# Patient Record
Sex: Female | Born: 1965 | Race: White | Hispanic: No | Smoking: Never smoker
Health system: Southern US, Community
[De-identification: ages and names within clinical notes are randomized; demographics above are authoritative.]

## PROBLEM LIST (undated history)

## (undated) DIAGNOSIS — E119 Type 2 diabetes mellitus without complications: Secondary | ICD-10-CM

## (undated) HISTORY — PX: BACK SURGERY: SHX140

## (undated) HISTORY — PX: GASTROPLASTY DUODENAL SWITCH: SHX1699

---

## 2015-02-20 ENCOUNTER — Encounter: Payer: Self-pay | Admitting: *Deleted

## 2015-02-20 ENCOUNTER — Ambulatory Visit
Admission: EM | Admit: 2015-02-20 | Discharge: 2015-02-20 | Disposition: A | Discharge status: Home / Self Care | Payer: BLUE CROSS/BLUE SHIELD | Attending: Family Medicine | Admitting: Family Medicine

## 2015-02-20 DIAGNOSIS — R300 Dysuria: Secondary | ICD-10-CM | POA: Diagnosis present

## 2015-02-20 DIAGNOSIS — N390 Urinary tract infection, site not specified: Secondary | ICD-10-CM | POA: Insufficient documentation

## 2015-02-20 HISTORY — DX: Type 2 diabetes mellitus without complications: E11.9

## 2015-02-20 LAB — URINALYSIS COMPLETE WITH MICROSCOPIC (ARMC ONLY)
BILIRUBIN URINE: NEGATIVE
GLUCOSE, UA: 250 mg/dL — AB
KETONES UR: NEGATIVE mg/dL
NITRITE: NEGATIVE
PROTEIN: NEGATIVE mg/dL
RBC / HPF: NONE SEEN RBC/hpf (ref <3)
SPECIFIC GRAVITY, URINE: 1.01 (ref 1.005–1.030)
pH: 5.5 (ref 5.0–8.0)

## 2015-02-20 MED ORDER — CIPROFLOXACIN HCL 500 MG PO TABS: 500.0000 mg | ORAL_TABLET | Freq: Two times a day (BID) | ORAL | Status: AC

## 2015-02-20 NOTE — ED Notes (Signed)
Symptons of urinary frequency an dburning started last PM.  Denies fever or chills, u/a pending.

## 2015-02-20 NOTE — ED Provider Notes (Signed)
Patient patient presents today with symptoms of urinary frequency with slight dysuria since yesterday. She denies any fever, chills, flank pain, abdominal pain, nausea, vomiting. She does get UTIs and usually has similar symptoms at the start. Patient denies being sexually active and no longer has regular periods.  Review of systems negative except mentioned above. Vitals as per chart. Gen.-No apparent distress HEENT-no pharyngeal erythema, no exudate Respiratory-CTA bilateral Cardiac-regular rate rhythm Abdomen-obese, nontender, no flank tenderness appreciated, no guarding or rebound  Assessment and Plan-UTI: We will send urine for culture, Cipro prescribed for patient, rest, hydration, seek medical attention if symptoms persist or worsen.    Jolene Provost, MD 02/20/15 1540

## 2015-03-25 LAB — URINE CULTURE

## 2016-02-25 ENCOUNTER — Ambulatory Visit
Admission: EM | Admit: 2016-02-25 | Discharge: 2016-02-25 | Disposition: A | Discharge status: Home / Self Care | Payer: Managed Care, Other (non HMO) | Attending: Family Medicine | Admitting: Family Medicine

## 2016-02-25 DIAGNOSIS — Z79899 Other long term (current) drug therapy: Secondary | ICD-10-CM | POA: Diagnosis not present

## 2016-02-25 DIAGNOSIS — N3001 Acute cystitis with hematuria: Secondary | ICD-10-CM | POA: Diagnosis not present

## 2016-02-25 DIAGNOSIS — Z7984 Long term (current) use of oral hypoglycemic drugs: Secondary | ICD-10-CM | POA: Insufficient documentation

## 2016-02-25 DIAGNOSIS — R300 Dysuria: Secondary | ICD-10-CM | POA: Diagnosis present

## 2016-02-25 DIAGNOSIS — E119 Type 2 diabetes mellitus without complications: Secondary | ICD-10-CM | POA: Insufficient documentation

## 2016-02-25 LAB — URINALYSIS COMPLETE WITH MICROSCOPIC (ARMC ONLY)
Bacteria, UA: NONE SEEN
Bilirubin Urine: NEGATIVE
GLUCOSE, UA: 500 mg/dL — AB
Leukocytes, UA: NEGATIVE
Nitrite: NEGATIVE
Protein, ur: NEGATIVE mg/dL
RBC / HPF: NONE SEEN RBC/hpf (ref 0–5)
SPECIFIC GRAVITY, URINE: 1.015 (ref 1.005–1.030)
pH: 6 (ref 5.0–8.0)

## 2016-02-25 LAB — GLUCOSE, CAPILLARY: GLUCOSE-CAPILLARY: 217 mg/dL — AB (ref 65–99)

## 2016-02-25 MED ORDER — PHENAZOPYRIDINE HCL 200 MG PO TABS: 200.0000 mg | ORAL_TABLET | Freq: Three times a day (TID) | ORAL | Status: DC

## 2016-02-25 MED ORDER — NITROFURANTOIN MONOHYD MACRO 100 MG PO CAPS: 100.0000 mg | ORAL_CAPSULE | Freq: Two times a day (BID) | ORAL | Status: DC

## 2016-02-25 NOTE — ED Provider Notes (Signed)
CSN: [id number]     Arrival date & time 02/25/16  [id number] History   None    Chief Complaint  Patient presents with  . Urinary Frequency    Pt with hx of UTIs. Awoke today with frequency and dysuria.    (Consider location/radiation/quality/duration/timing/severity/associated sxs/prior Treatment) HPI Comments: Patient presents today for possible urinary tract infection. Symptoms started this morning. Symptoms are urinary urgency, urinary frequency, dysuria, and right flank pian. Denies abdominal pain and nausea. Patient have had UTI in the past with similar symptoms as today. Patient has type 2 diabetes. Her hemoglobin is 7.4 this month. She is taking [place]Farxiga.       Past Medical History  Diagnosis Date  . Diabetes mellitus without complication [facility](HCC)    Past Surgical History  Procedure Laterality Date  . Back surgery     Family History  Problem Relation Age of Onset  . Heart failure Mother   . Diabetes Mother    Social History  Substance Use Topics  . Smoking status: Never Smoker   . Smokeless tobacco: None  . Alcohol Use: Yes     Comment: social   OB History    No data available     Review of Systems  Constitutional: Negative for fever and fatigue.  Respiratory: Negative for shortness of breath.   Cardiovascular: Negative for chest pain.  Gastrointestinal: Negative for nausea and abdominal pain.  Genitourinary: Positive for dysuria, urgency, frequency and flank pain.    Allergies  Percocet and Septra  Home Medications   Prior to Admission medications   Medication Sig Start Date End Date Taking? Authorizing Provider  dapagliflozin propanediol (FARXIGA) 10 MG TABS tablet Take 10 mg by mouth daily.   Yes Historical Provider, MD  furosemide (LASIX) 20 MG tablet Take 20 mg by mouth as needed.   Yes Historical Provider, MD  norgestrel-ethinyl estradiol (LO/OVRAL,CRYSELLE) 0.3-30 MG-MCG tablet Take 1 tablet by mouth daily.   Yes Historical Provider, MD  metformin  (FORTAMET) 1000 MG (OSM) 24 hr tablet Take 2,000 mg by mouth daily with breakfast.    Historical Provider, MD  nitrofurantoin, macrocrystal-monohydrate, (MACROBID) 100 MG capsule Take 1 capsule (100 mg total) by mouth 2 (two) times daily. 02/25/16   Lucia EstelleFeng Mayah Urquidi, NP  phenazopyridine (PYRIDIUM) 200 MG tablet Take 1 tablet (200 mg total) by mouth 3 (three) times daily. 02/25/16   Lucia EstelleFeng Aeralyn Barna, NP   Meds Ordered and Administered this Visit  Medications - No data to display  BP 129/77 mmHg  Pulse 104  Temp(Src) 98 F (36.7 C)  Resp 22  Ht 5\' 4"  (1.626 m)  Wt 289 lb (131.09 kg)  BMI 49.58 kg/m2  SpO2 100%  LMP 02/19/2016 No data found.   Physical Exam  Constitutional: She appears well-developed and well-nourished.  Cardiovascular: Normal rate, regular rhythm and normal heart sounds.   Pulmonary/Chest: Effort normal and breath sounds normal.  Abdominal: Soft. Bowel sounds are normal. She exhibits no mass.  Suprapubic tenderness present  Genitourinary:  Negative bilateral CVA tenderness    ED Course  Procedures (including critical care time)  Labs Review Labs Reviewed  URINALYSIS COMPLETEWITH MICROSCOPIC ([facility] ONLY) - Abnormal; Notable for the following:    Glucose, UA 500 (*)    Ketones, ur 2+ (*)    Hgb urine dipstick TRACE (*)    Squamous Epithelial / LPF 0-5 (*)    All other components within normal limits  GLUCOSE, CAPILLARY - Abnormal; Notable for the following:    Glucose-Capillary 217 (*)  All other components within normal limits  URINE CULTURE  CBG MONITORING, ED    Imaging Review No results found.   Visual Acuity Review  Right Eye Distance:   Left Eye Distance:   Bilateral Distance:    Right Eye Near:   Left Eye Near:    Bilateral Near:         MDM   1. Acute cystitis with hematuria    Present today for possible UTI. Urinalysis was negative for Nitrate and Leukocyte. Urine culture have been ordered. She is taking [place]Farxiga, which explains the high  content of glucose in her urine. FSBG is not concerning. Antibiotic given along with pyridium. Patient instructed to follow up with her PCP if not better.   Lucia EstelleFeng Jelina Paulsen, NP 02/25/16 1046

## 2016-02-25 NOTE — Discharge Instructions (Signed)

## 2016-02-28 LAB — URINE CULTURE: Culture: >=100000 — AB

## 2016-03-05 ENCOUNTER — Other Ambulatory Visit: Payer: Self-pay | Admitting: Specialist

## 2016-03-13 ENCOUNTER — Ambulatory Visit
Admission: RE | Admit: 2016-03-13 | Discharge: 2016-03-13 | Disposition: A | Discharge status: Home / Self Care | Payer: Managed Care, Other (non HMO) | Source: Ambulatory Visit | Attending: Specialist | Admitting: Specialist

## 2016-03-13 DIAGNOSIS — G4733 Obstructive sleep apnea (adult) (pediatric): Secondary | ICD-10-CM | POA: Insufficient documentation

## 2016-03-13 DIAGNOSIS — E119 Type 2 diabetes mellitus without complications: Secondary | ICD-10-CM | POA: Insufficient documentation

## 2016-03-13 DIAGNOSIS — K8020 Calculus of gallbladder without cholecystitis without obstruction: Secondary | ICD-10-CM | POA: Insufficient documentation

## 2017-02-21 ENCOUNTER — Ambulatory Visit
Admission: EM | Admit: 2017-02-21 | Discharge: 2017-02-21 | Disposition: A | Discharge status: Home / Self Care | Payer: 59 | Attending: Family Medicine | Admitting: Family Medicine

## 2017-02-21 DIAGNOSIS — R300 Dysuria: Secondary | ICD-10-CM

## 2017-02-21 DIAGNOSIS — N3000 Acute cystitis without hematuria: Secondary | ICD-10-CM | POA: Diagnosis not present

## 2017-02-21 LAB — URINALYSIS, COMPLETE (UACMP) WITH MICROSCOPIC
Bilirubin Urine: NEGATIVE
Glucose, UA: NEGATIVE mg/dL
Hgb urine dipstick: NEGATIVE
Ketones, ur: 15 mg/dL — AB
Nitrite: NEGATIVE
Protein, ur: NEGATIVE mg/dL
Specific Gravity, Urine: 1.02 (ref 1.005–1.030)
pH: 6 (ref 5.0–8.0)

## 2017-02-21 MED ORDER — NITROFURANTOIN MONOHYD MACRO 100 MG PO CAPS: 100.0000 mg | ORAL_CAPSULE | Freq: Two times a day (BID) | ORAL | 0 refills | Status: DC

## 2017-02-21 MED ORDER — PHENAZOPYRIDINE HCL 200 MG PO TABS: 200.0000 mg | ORAL_TABLET | Freq: Three times a day (TID) | ORAL | 0 refills | Status: DC | PRN

## 2017-02-21 MED ORDER — FLUCONAZOLE 150 MG PO TABS: 150.0000 mg | ORAL_TABLET | Freq: Once | ORAL | 0 refills | Status: AC

## 2017-02-21 NOTE — ED Triage Notes (Signed)
Pt c/o painful urination, frequency, and pressure.

## 2017-02-21 NOTE — ED Provider Notes (Signed)
[facility]    CSN: [id number] Arrival date & time: 02/21/17  1245     History   Chief Complaint Chief Complaint  Patient presents with  . Urinary Tract Infection    HPI Heather Golden is a 51 y.o. female.   Patient is a 51 year old white female started having burning in frequency with urination about 4 days ago. She states she's had UTIs before (pre-similar to what she's had before. She does not smoke she is allergic to Septra. She denies any vaginal discharge or dyspareunia. She has history of diabetes she's had back surgery before she does not smoke no pertinent family medical history relevant to today's visit.   The history is provided by the patient. No language interpreter was used.  Urinary Tract Infection  Pain quality:  Aching Pain severity:  Moderate Onset quality:  Sudden Timing:  Constant Progression:  Worsening Chronicity:  New Recent urinary tract infections: no   Relieved by:  Nothing Worsened by:  Nothing Ineffective treatments:  None tried Urinary symptoms: frequent urination and hesitancy   Urinary symptoms: no foul-smelling urine   Associated symptoms: abdominal pain   Associated symptoms: no fever and no vaginal discharge   Risk factors: recurrent urinary tract infections and sexually active   Risk factors: no sexually transmitted infections     Past Medical History:  Diagnosis Date  . Diabetes mellitus without complication (HCC)     There are no active problems to display for this patient.   Past Surgical History:  Procedure Laterality Date  . BACK SURGERY      OB History    No data available       Home Medications    Prior to Admission medications   Medication Sig Start Date End Date Taking? Authorizing Provider  dapagliflozin propanediol (FARXIGA) 10 MG TABS tablet Take 10 mg by mouth daily.    [provider]  fluconazole (DIFLUCAN) 150 MG tablet Take 1 tablet (150 mg total) by mouth once. 02/21/17 02/21/17   Hassan Rowan, MD  furosemide (LASIX) 20 MG tablet Take 20 mg by mouth as needed.    [provider]  metformin (FORTAMET) 1000 MG (OSM) 24 hr tablet Take 2,000 mg by mouth daily with breakfast.    [provider]  nitrofurantoin, macrocrystal-monohydrate, (MACROBID) 100 MG capsule Take 1 capsule (100 mg total) by mouth 2 (two) times daily. 02/21/17   Hassan Rowan, MD  norgestrel-ethinyl estradiol (LO/OVRAL,CRYSELLE) 0.3-30 MG-MCG tablet Take 1 tablet by mouth daily.    [provider]  phenazopyridine (PYRIDIUM) 200 MG tablet Take 1 tablet (200 mg total) by mouth 3 (three) times daily as needed for pain. 02/21/17   Hassan Rowan, MD    Family History Family History  Problem Relation Age of Onset  . Heart failure Mother   . Diabetes Mother     Social History Social History  Substance Use Topics  . Smoking status: Never Smoker  . Smokeless tobacco: Never Used  . Alcohol use Yes     Comment: social     Allergies   Percocet [oxycodone-acetaminophen] and Septra [sulfamethoxazole-trimethoprim]   Review of Systems Review of Systems  Constitutional: Negative for fever.  Gastrointestinal: Positive for abdominal pain.  Genitourinary: Positive for difficulty urinating, dysuria, frequency and urgency. Negative for dyspareunia, vaginal bleeding and vaginal discharge.  All other systems reviewed and are negative.    Physical Exam Triage Vital Signs ED Triage Vitals  Enc Vitals Group     BP 02/21/17 1258  113/82     Pulse Rate 02/21/17 1258 94     Resp 02/21/17 1258 18     Temp 02/21/17 1258 99.2 F (37.3 C)     Temp Source 02/21/17 1258 Oral     SpO2 02/21/17 1258 99 %     Weight 02/21/17 1257 220 lb (99.8 kg)     Height 02/21/17 1257 5\' 4"  (1.626 m)     Head Circumference --      Peak Flow --      Pain Score 02/21/17 1257 8     Pain Loc --      Pain Edu? --      Excl. in GC? --    No data found.   Updated Vital Signs BP 113/82 (BP Location: Left  Arm)   Pulse 94   Temp 99.2 F (37.3 C) (Oral)   Resp 18   Ht 5\' 4"  (1.626 m)   Wt 220 lb (99.8 kg)   SpO2 99%   BMI 37.76 kg/m   Visual Acuity Right Eye Distance:   Left Eye Distance:   Bilateral Distance:    Right Eye Near:   Left Eye Near:    Bilateral Near:     Physical Exam  Constitutional: She is oriented to person, place, and time. She appears well-developed and well-nourished. No distress.  HENT:  Head: Normocephalic and atraumatic.  Eyes: Pupils are equal, round, and reactive to light.  Neck: Normal range of motion. Neck supple.  Pulmonary/Chest: Effort normal.  Abdominal: Soft. There is tenderness in the suprapubic area. There is no CVA tenderness.  Musculoskeletal: Normal range of motion.  Lymphadenopathy:    She has no cervical adenopathy.  Neurological: She is alert and oriented to person, place, and time.  Skin: Skin is warm. She is not diaphoretic.  Psychiatric: She has a normal mood and affect.  Vitals reviewed.    UC Treatments / Results  Labs (all labs ordered are listed, but only abnormal results are displayed) Labs Reviewed  URINALYSIS, COMPLETE (UACMP) WITH MICROSCOPIC - Abnormal; Notable for the following:       Result Value   APPearance HAZY (*)    Ketones, ur 15 (*)    Leukocytes, UA TRACE (*)    Squamous Epithelial / LPF 6-30 (*)    Bacteria, UA MANY (*)    All other components within normal limits  URINE CULTURE    EKG  EKG Interpretation None       Radiology No results found.  Procedures Procedures (including critical care time)  Medications Ordered in UC Medications - No data to display  Results for orders placed or performed during the hospital encounter of 02/21/17  Urinalysis, Complete w Microscopic  Result Value Ref Range   Color, Urine YELLOW YELLOW   APPearance HAZY (A) CLEAR   Specific Gravity, Urine 1.020 1.005 - 1.030   pH 6.0 5.0 - 8.0   Glucose, UA NEGATIVE NEGATIVE mg/dL   Hgb urine dipstick NEGATIVE  NEGATIVE   Bilirubin Urine NEGATIVE NEGATIVE   Ketones, ur 15 (A) NEGATIVE mg/dL   Protein, ur NEGATIVE NEGATIVE mg/dL   Nitrite NEGATIVE NEGATIVE   Leukocytes, UA TRACE (A) NEGATIVE   Squamous Epithelial / LPF 6-30 (A) NONE SEEN   WBC, UA 6-30 0 - 5 WBC/hpf   RBC / HPF 0-5 0 - 5 RBC/hpf   Bacteria, UA MANY (A) NONE SEEN   Initial Impression / Assessment and Plan / UC Course  I have reviewed the  triage vital signs and the nursing notes.  Pertinent labs & imaging results that were available during my care of the patient were reviewed by me and considered in my medical decision making (see chart for details).   will treat with Macrobid since patient 6 place on Diflucan 1 tablet repeat in a week needed and Pyridium 200 mg 2 times a day for 5 days follow up with PCP as needed.    Final Clinical Impressions(s) / UC Diagnoses   Final diagnoses:  Dysuria  Acute cystitis without hematuria    New Prescriptions Discharge Medication List as of 02/21/2017  1:51 PM    START taking these medications   Details  fluconazole (DIFLUCAN) 150 MG tablet Take 1 tablet (150 mg total) by mouth once., Starting Thu 02/21/2017, Normal    nitrofurantoin, macrocrystal-monohydrate, (MACROBID) 100 MG capsule Take 1 capsule (100 mg total) by mouth 2 (two) times daily., Starting Thu 02/21/2017, Normal    phenazopyridine (PYRIDIUM) 200 MG tablet Take 1 tablet (200 mg total) by mouth 3 (three) times daily as needed for pain., Starting Thu 02/21/2017, Normal        Note: This dictation was prepared with Dragon dictation along with smaller phrase technology. Any transcriptional errors that result from this process are unintentional.   Hassan Rowan, MD 02/21/17 1418

## 2017-02-22 LAB — URINE CULTURE: Special Requests: NORMAL

## 2017-11-11 ENCOUNTER — Ambulatory Visit
Admission: EM | Admit: 2017-11-11 | Discharge: 2017-11-11 | Disposition: A | Discharge status: Home / Self Care | Payer: 59 | Attending: Family Medicine | Admitting: Family Medicine

## 2017-11-11 ENCOUNTER — Other Ambulatory Visit: Payer: Self-pay

## 2017-11-11 DIAGNOSIS — J069 Acute upper respiratory infection, unspecified: Secondary | ICD-10-CM | POA: Diagnosis not present

## 2017-11-11 DIAGNOSIS — B9789 Other viral agents as the cause of diseases classified elsewhere: Secondary | ICD-10-CM

## 2017-11-11 MED ORDER — HYDROCOD POLST-CPM POLST ER 10-8 MG/5ML PO SUER: 5.0000 mL | Freq: Every evening | ORAL | 0 refills | Status: DC | PRN

## 2017-11-11 MED ORDER — BENZONATATE 100 MG PO CAPS: 100.0000 mg | ORAL_CAPSULE | Freq: Three times a day (TID) | ORAL | 0 refills | Status: DC | PRN

## 2017-11-11 NOTE — Discharge Instructions (Signed)
Take medication as prescribed. Rest. Drink plenty of fluids.  ° °Follow up with your primary care physician this week as needed. Return to Urgent care for new or worsening concerns.  ° °

## 2017-11-11 NOTE — ED Triage Notes (Signed)
Patient complains of cough, sinus pain and pressure x 6 days while she was on a cruise. Patient also has some hoarseness. Patient states that she is about to leave for a work trip coming up.

## 2017-11-11 NOTE — ED Provider Notes (Signed)
[facility] ____________________________________________  Time seen: Approximately 10:20 AM  I have reviewed the triage vital signs and the nursing notes.   HISTORY  Chief Complaint Cough (APPT)   HPI Heather Golden is a 52 y.o. female presenting for evaluation of 6 days of what started out with a sore throat, and then gradually led to nasal congestion, sinus pressure and sinus drainage.  States cough present and cough has been disrupting sleep in the last few days.  Denies known fevers.  States sore throat started after first day of her being on a cruise, and she states that she felt like it may be was from initially allergies.  Denies other known sick contacts.  States cough continues to disrupt sleep.  Denies associated chest pain, shortness of breath or hemoptysis.  States mild sinus pressure currently, but states is intermittently.  States some fluid sensation in her ears.  Continues to eat and drink well.  States mild scratchy throat currently and states mostly associated with postnasal drainage.  Denies sore throat feeling like previous strep throat.  States symptoms unresolved with over-the-counter congestion medications.  Denies other aggravating or alleviating factors. Denies chest pain, shortness of breath, abdominal pain, extremity pain, extremity swelling or rash. Denies recent sickness. Denies recent antibiotic use.   Manfred ArchWithrow, Glenn, MD: PCP   Past Medical History:  Diagnosis Date  . Diabetes mellitus without complication (HCC)     There are no active problems to display for this patient.   Past Surgical History:  Procedure Laterality Date  . BACK SURGERY    . GASTROPLASTY DUODENAL SWITCH       No current facility-administered medications for this encounter.   Current Outpatient Medications:  .  buPROPion (WELLBUTRIN XL) 150 MG 24 hr tablet, Take 150 mg by mouth daily., Disp: , Rfl:  .  etonogestrel-ethinyl estradiol (NUVARING) 0.12-0.015 MG/24HR  vaginal ring, Place 1 each vaginally every 28 (twenty-eight) days. Insert vaginally and leave in place for 3 consecutive weeks, then remove for 1 week., Disp: , Rfl:  .  Multiple Vitamins-Minerals (MULTIVITAMIN WITH MINERALS) tablet, Take 1 tablet by mouth daily., Disp: , Rfl:  .  benzonatate (TESSALON PERLES) 100 MG capsule, Take 1 capsule (100 mg total) by mouth 3 (three) times daily as needed for cough., Disp: 15 capsule, Rfl: 0 .  chlorpheniramine-HYDROcodone (TUSSIONEX PENNKINETIC ER) 10-8 MG/5ML SUER, Take 5 mLs by mouth at bedtime as needed for cough. do not drive or operate machinery while taking as can cause drowsiness., Disp: 60 mL, Rfl: 0  Allergies Percocet [oxycodone-acetaminophen] and Septra [sulfamethoxazole-trimethoprim]  Family History  Problem Relation Age of Onset  . Heart failure Mother   . Diabetes Mother     Social History Social History   Tobacco Use  . Smoking status: Never Smoker  . Smokeless tobacco: Never Used  Substance Use Topics  . Alcohol use: Yes    Comment: social  . Drug use: No    Review of Systems Constitutional: No fever/chills ENT: As above Cardiovascular: Denies chest pain. Respiratory: Denies shortness of breath. Gastrointestinal: No abdominal pain.  Musculoskeletal: Negative for back pain. Skin: Negative for rash.   ____________________________________________   PHYSICAL EXAM:  VITAL SIGNS: ED Triage Vitals  Enc Vitals Group     BP 11/11/17 0920 120/82     Pulse Rate 11/11/17 0920 86     Resp 11/11/17 0920 18     Temp 11/11/17 0920 98.9 F (37.2 C)     Temp Source 11/11/17 0920 Oral  SpO2 11/11/17 0920 100 %     Weight 11/11/17 0918 180 lb (81.6 kg)     Height 11/11/17 0918 5\' 4"  (1.626 m)     Head Circumference --      Peak Flow --      Pain Score 11/11/17 0916 0     Pain Loc --      Pain Edu? --      Excl. in GC? --     Constitutional: Alert and oriented. Well appearing and in no acute distress. Eyes:  Conjunctivae are normal.  Head: Atraumatic. No tenderness to palpation bilateral frontal and maxillary sinuses. No swelling. No erythema.   Ears: no erythema, normal TMs bilaterally.   Nose: nasal congestion with bilateral nasal turbinate erythema and edema.   Mouth/Throat: Mucous membranes are moist.  Oropharynx non-erythematous.No tonsillar swelling or exudate.  Neck: No stridor.  No cervical spine tenderness to palpation. Hematological/Lymphatic/Immunilogical: No cervical lymphadenopathy. Cardiovascular: Normal rate, regular rhythm. Grossly normal heart sounds.  Good peripheral circulation. Respiratory: Normal respiratory effort.  No retractions.No wheezes, rales or rhonchi. Good air movement.  Occasional dry cough noted in room.  Speaks in complete sentences. Musculoskeletal: Steady gait.  No cervical, thoracic or lumbar tenderness to palpation.  Neurologic:  Normal speech and language. No gross focal neurologic deficits are appreciated. No gait instability. Skin:  Skin is warm, dry and intact. No rash noted. Psychiatric: Mood and affect are normal. Speech and behavior are normal.  ___________________________________________   LABS (all labs ordered are listed, but only abnormal results are displayed)  Labs Reviewed - No data to display  PROCEDURES Procedures    INITIAL IMPRESSION / ASSESSMENT AND PLAN / ED COURSE  Pertinent labs & imaging results that were available during my care of the patient were reviewed by me and considered in my medical decision making (see chart for details).  Well appearing patient.  No acute distress.  Suspect viral upper respiratory infection and viral pharyngitis.  Patient denies sensation of previous strep throat.  Encourage supportive care, PRN Tessalon Perles and as needed Tussionex.  States allergic to Percocet, however has tolerated Tussionex well in the past.  Over-the-counter antihistamine.  Encourage rest, fluids, supportive care.Discussed  indication, risks and benefits of medications with patient.  Discussed follow up with Primary care physician this week. Discussed follow up and return parameters including no resolution or any worsening concerns. Patient verbalized understanding and agreed to plan.   ____________________________________________   FINAL CLINICAL IMPRESSION(S) / ED DIAGNOSES  Final diagnoses:  Viral URI with cough     ED Discharge Orders        Ordered    chlorpheniramine-HYDROcodone (TUSSIONEX PENNKINETIC ER) 10-8 MG/5ML SUER  At bedtime PRN     11/11/17 1033    benzonatate (TESSALON PERLES) 100 MG capsule  3 times daily PRN     11/11/17 1033       Note: This dictation was prepared with Dragon dictation along with smaller phrase technology. Any transcriptional errors that result from this process are unintentional.         Renford Dills, NP 11/11/17 1048

## 2018-06-04 IMAGING — US US ABDOMEN LIMITED
1 series · 14 of 25 positions shown · non-contrast
Comparison: None.

CLINICAL DATA: Morbid obesity.  Preop gastric bypass.

EXAM:
US ABDOMEN LIMITED - RIGHT UPPER QUADRANT

[Series 1: us abdomen limited · 0.23mm/px · 14 of 59 slices shown]
[im 1/59]
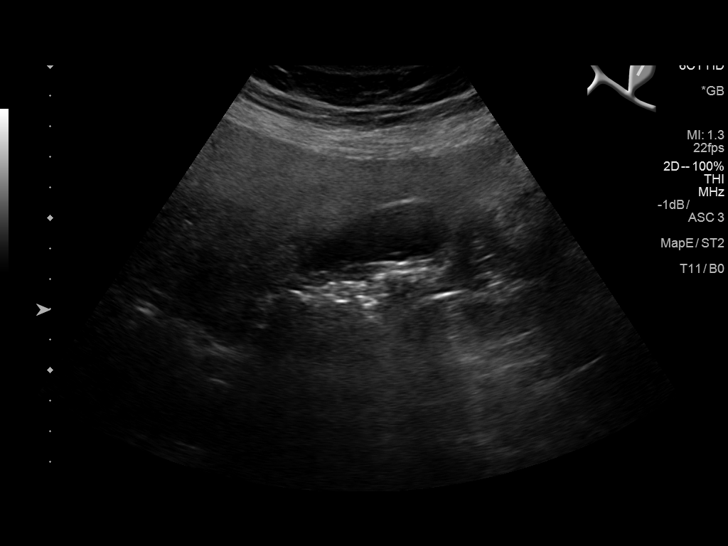
[im 5/59]
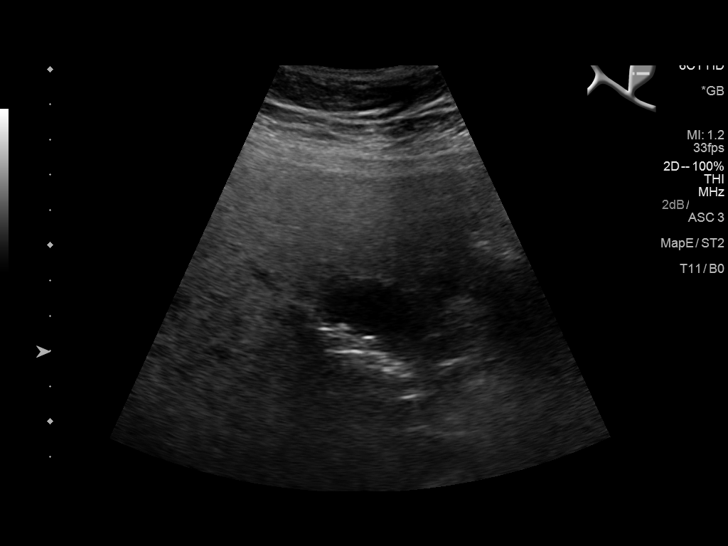
[im 10/59]
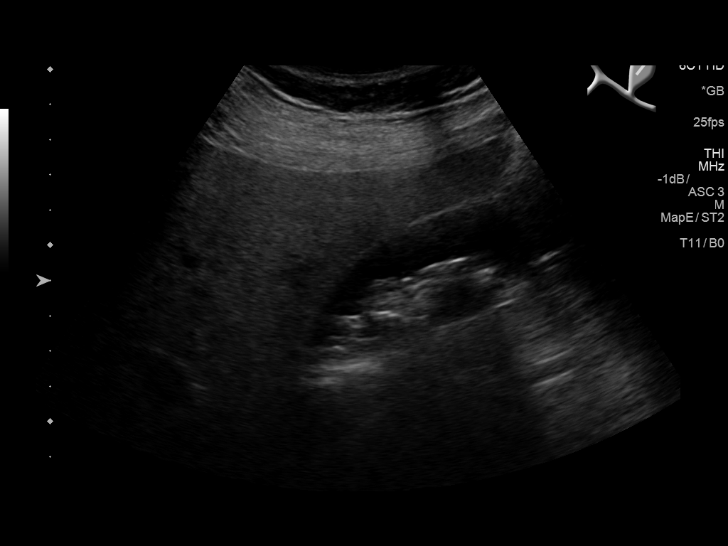
[im 15/59]
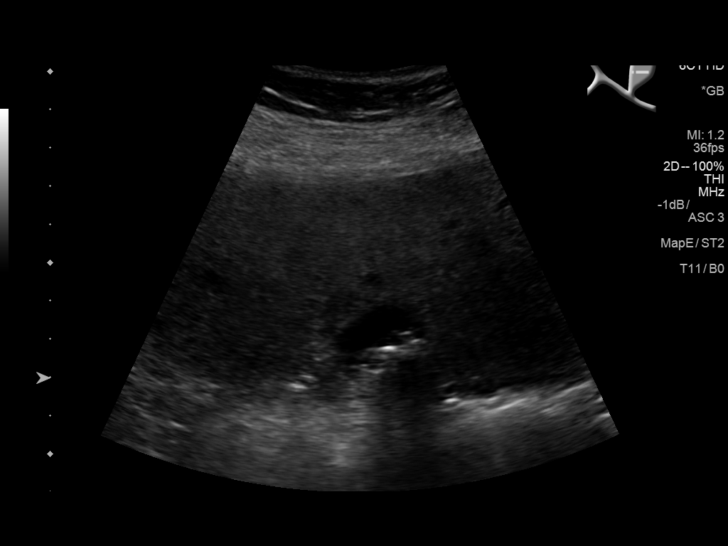
[im 20/59]
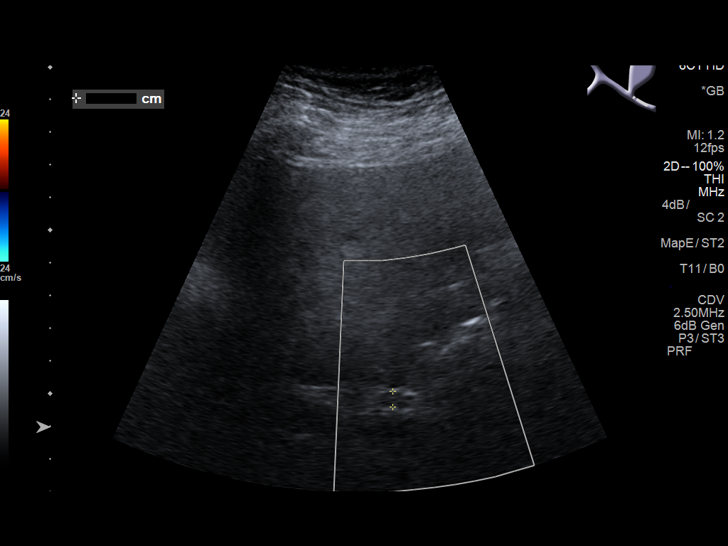
[im 22/59]
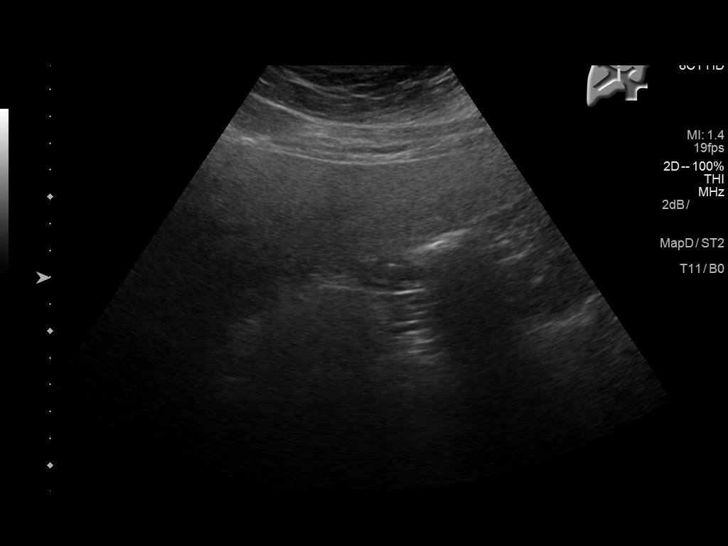
[im 27/59]
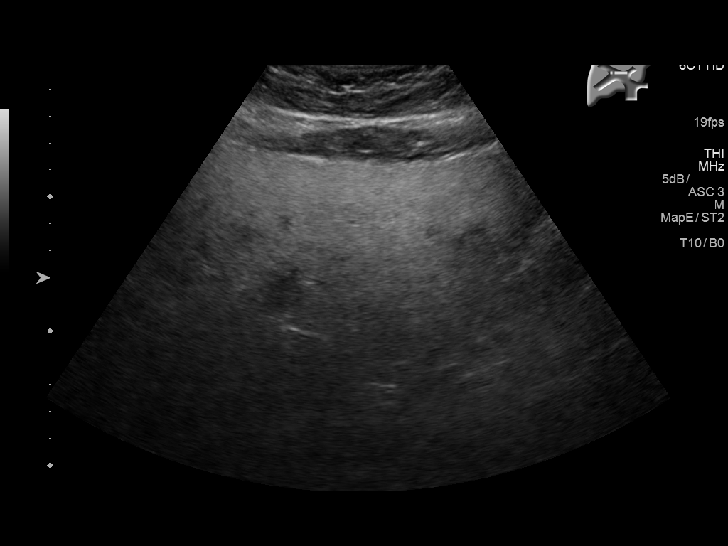
[im 32/59]
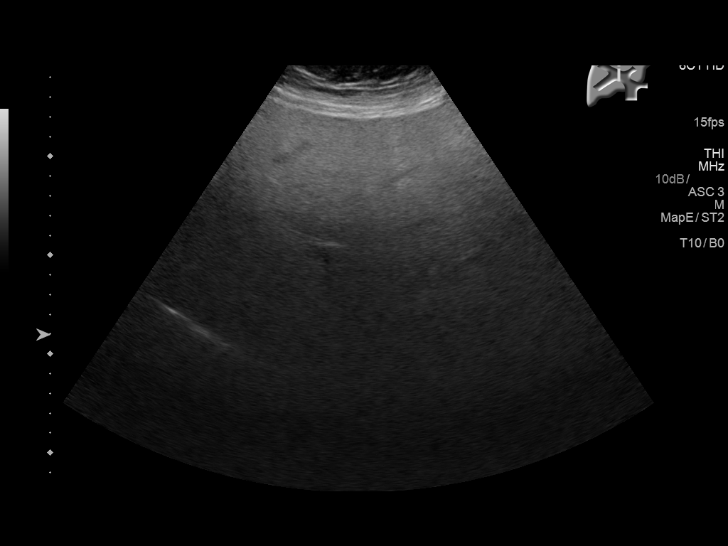
[im 37/59]
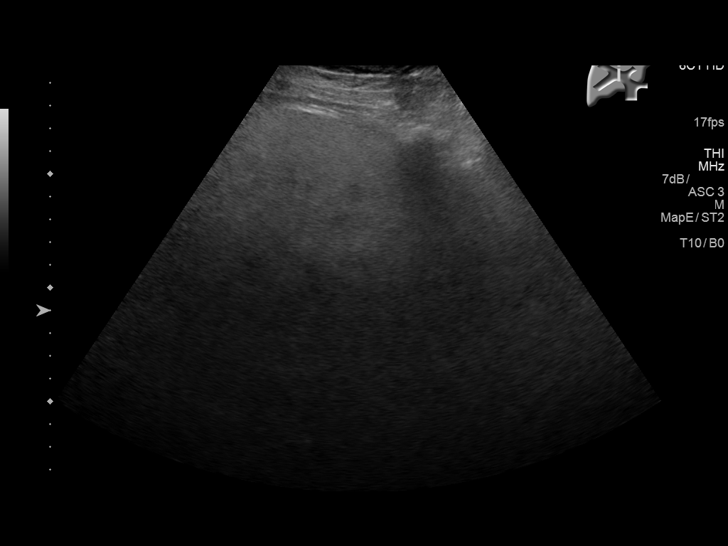
[im 39/59]
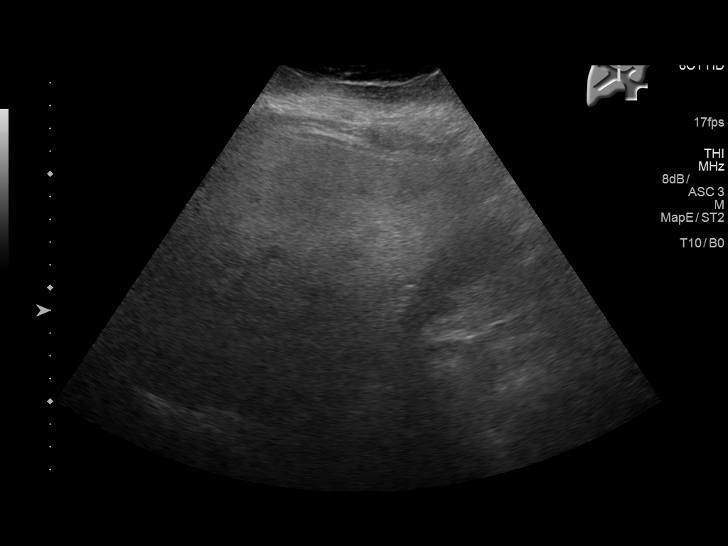
[im 44/59]
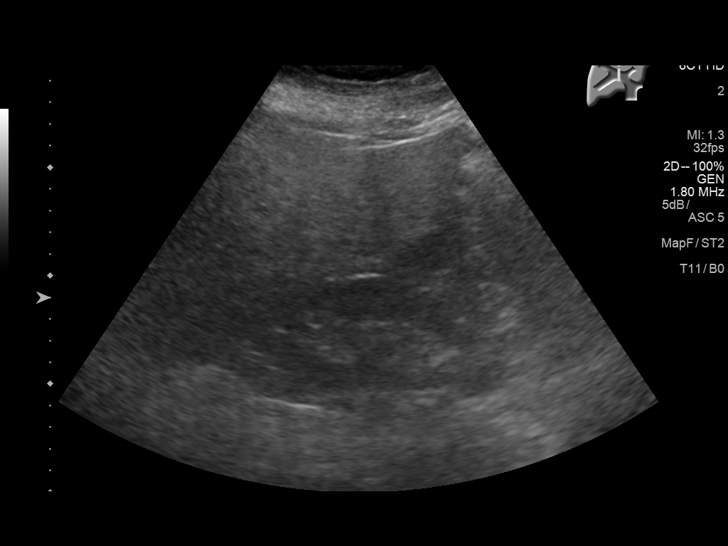
[im 49/59]
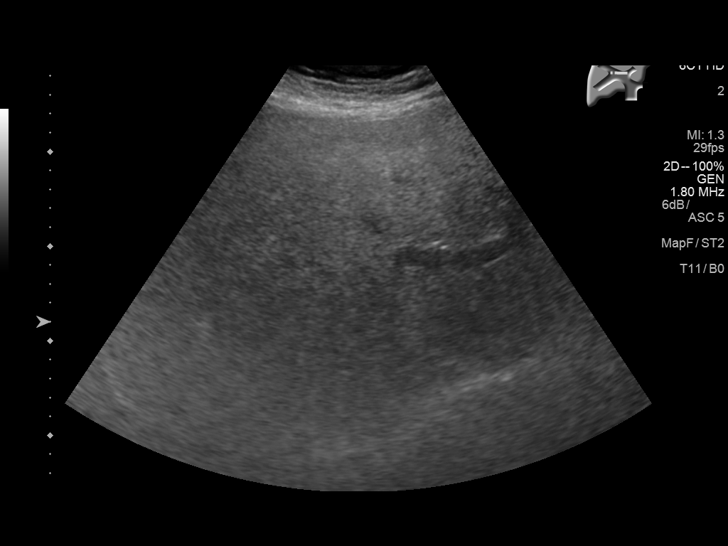
[im 54/59]
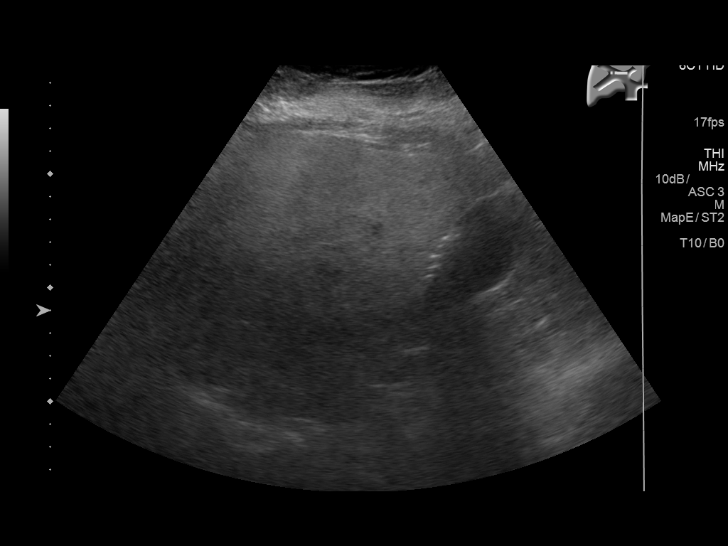
[im 59/59]
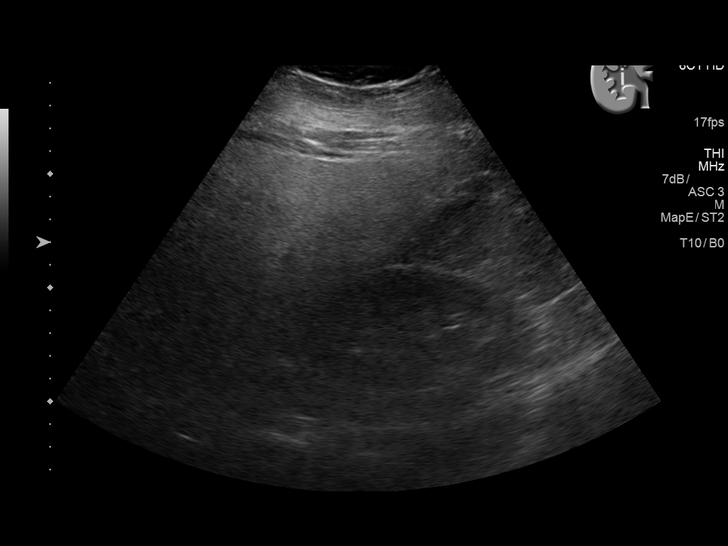

[14 of 25 positions shown; findings below may reference images not displayed]

FINDINGS: Gallbladder:

Multiple small mobile layering gallstones, the largest 7 mm. No wall
thickening or sonographic Murphy's sign.

Common bile duct:

Diameter: Normal caliber, 5 mm

Liver:

Increased echotexture throughout the liver compatible with fatty
infiltration. No focal abnormality or biliary duct dilatation.
IMPRESSION: Multiple small layering gallstones. No evidence of acute
cholecystitis.

## 2020-02-12 ENCOUNTER — Encounter: Payer: Self-pay | Admitting: Emergency Medicine

## 2020-02-12 ENCOUNTER — Other Ambulatory Visit: Payer: Self-pay

## 2020-02-12 ENCOUNTER — Ambulatory Visit
Admission: EM | Admit: 2020-02-12 | Discharge: 2020-02-12 | Disposition: A | Discharge status: Home / Self Care | Payer: 59 | Attending: Family Medicine | Admitting: Family Medicine

## 2020-02-12 DIAGNOSIS — N390 Urinary tract infection, site not specified: Secondary | ICD-10-CM | POA: Diagnosis not present

## 2020-02-12 LAB — URINALYSIS, COMPLETE (UACMP) WITH MICROSCOPIC
Bilirubin Urine: NEGATIVE
Glucose, UA: NEGATIVE mg/dL
Ketones, ur: NEGATIVE mg/dL
Nitrite: NEGATIVE
Protein, ur: NEGATIVE mg/dL
Specific Gravity, Urine: >1.03 — ABNORMAL HIGH (ref 1.005–1.030)
pH: 5.5 (ref 5.0–8.0)

## 2020-02-12 MED ORDER — CEPHALEXIN 500 MG PO CAPS: 500.0000 mg | ORAL_CAPSULE | Freq: Two times a day (BID) | ORAL | 0 refills | Status: DC

## 2020-02-12 MED ORDER — FLUCONAZOLE 150 MG PO TABS: 150.0000 mg | ORAL_TABLET | Freq: Every day | ORAL | 0 refills | Status: DC

## 2020-02-12 NOTE — ED Triage Notes (Signed)
Patient c/o bladder pressure and discomfort and urinary urgency that started today.

## 2020-02-12 NOTE — Discharge Instructions (Signed)
Increase water intake

## 2020-02-12 NOTE — ED Provider Notes (Signed)
[facility]    CSN: [id number] Arrival date & time: 02/12/20  1620      History   Chief Complaint Chief Complaint  Patient presents with  . Urinary Frequency    HPI Heather Golden is a 54 y.o. female.   54 yo female with a c/o bladder pressure/discomfort and urinary urgency since this morning. Denies any fevers, chills, nausea, vomiting, abdominal pain, flank pain. Has not taken anything for this.    Urinary Frequency    Past Medical History:  Diagnosis Date  . Diabetes mellitus without complication (HCC)     There are no problems to display for this patient.   Past Surgical History:  Procedure Laterality Date  . BACK SURGERY    . GASTROPLASTY DUODENAL SWITCH      OB History   No obstetric history on file.      Home Medications    Prior to Admission medications   Medication Sig Start Date End Date Taking? Authorizing Provider  buPROPion (WELLBUTRIN XL) 150 MG 24 hr tablet Take 150 mg by mouth daily.   Yes [provider]  etonogestrel-ethinyl estradiol (NUVARING) 0.12-0.015 MG/24HR vaginal ring Place 1 each vaginally every 28 (twenty-eight) days. Insert vaginally and leave in place for 3 consecutive weeks, then remove for 1 week.   Yes [provider]  levonorgestrel (MIRENA) 20 MCG/24HR IUD 1 each by Intrauterine route once.   Yes [provider]  Multiple Vitamins-Minerals (MULTIVITAMIN WITH MINERALS) tablet Take 1 tablet by mouth daily.   Yes [provider]  phentermine (ADIPEX-P) 37.5 MG tablet Take 18.75 mg by mouth every morning. 12/18/19  Yes [provider]  benzonatate (TESSALON PERLES) 100 MG capsule Take 1 capsule (100 mg total) by mouth 3 (three) times daily as needed for cough. 11/11/17   Renford Dills, NP  cephALEXin (KEFLEX) 500 MG capsule Take 1 capsule (500 mg total) by mouth 2 (two) times daily. 02/12/20   Payton Mccallum, MD  chlorpheniramine-HYDROcodone (TUSSIONEX PENNKINETIC ER) 10-8  MG/5ML SUER Take 5 mLs by mouth at bedtime as needed for cough. do not drive or operate machinery while taking as can cause drowsiness. 11/11/17   Renford Dills, NP  fluconazole (DIFLUCAN) 150 MG tablet Take 1 tablet (150 mg total) by mouth daily. 02/12/20   Payton Mccallum, MD    Family History Family History  Problem Relation Age of Onset  . Heart failure Mother   . Diabetes Mother     Social History Social History   Tobacco Use  . Smoking status: Never Smoker  . Smokeless tobacco: Never Used  Vaping Use  . Vaping Use: Never used  Substance Use Topics  . Alcohol use: Yes    Comment: social  . Drug use: No     Allergies   Percocet [oxycodone-acetaminophen] and Septra [sulfamethoxazole-trimethoprim]   Review of Systems Review of Systems  Genitourinary: Positive for frequency.     Physical Exam Triage Vital Signs ED Triage Vitals  Enc Vitals Group     BP 02/12/20 1633 108/73     Pulse Rate 02/12/20 1633 77     Resp 02/12/20 1633 14     Temp 02/12/20 1633 98.4 F (36.9 C)     Temp Source 02/12/20 1633 Oral     SpO2 02/12/20 1633 100 %     Weight 02/12/20 1631 195 lb (88.5 kg)     Height 02/12/20 1631 5\' 4"  (1.626 m)     Head Circumference --  Peak Flow --      Pain Score 02/12/20 1631 0     Pain Loc --      Pain Edu? --      Excl. in [place]? --    No data found.  Updated Vital Signs BP 108/73 (BP Location: Left Arm)   Pulse 77   Temp 98.4 F (36.9 C) (Oral)   Resp 14   Ht 5\' 4"  (1.626 m)   Wt 88.5 kg   SpO2 100%   BMI 33.47 kg/m   Visual Acuity Right Eye Distance:   Left Eye Distance:   Bilateral Distance:    Right Eye Near:   Left Eye Near:    Bilateral Near:     Physical Exam Vitals and nursing note reviewed.  Constitutional:      General: She is not in acute distress.    Appearance: She is not toxic-appearing or diaphoretic.  Abdominal:     General: There is no distension.     Palpations: Abdomen is soft.  Neurological:      Mental Status: She is alert.      UC Treatments / Results  Labs (all labs ordered are listed, but only abnormal results are displayed) Labs Reviewed  URINALYSIS, COMPLETE (UACMP) WITH MICROSCOPIC - Abnormal; Notable for the following components:      Result Value   APPearance HAZY (*)    Specific Gravity, Urine >1.030 (*)    Hgb urine dipstick TRACE (*)    Leukocytes,Ua SMALL (*)    Bacteria, UA MANY (*)    All other components within normal limits  URINE CULTURE    EKG   Radiology No results found.  Procedures Procedures (including critical care time)  Medications Ordered in UC Medications - No data to display  Initial Impression / Assessment and Plan / UC Course  I have reviewed the triage vital signs and the nursing notes.  Pertinent labs & imaging results that were available during my care of the patient were reviewed by me and considered in my medical decision making (see chart for details).      Final Clinical Impressions(s) / UC Diagnoses   Final diagnoses:  Lower urinary tract infectious disease     Discharge Instructions     Increase water intake    ED Prescriptions    Medication Sig Dispense Auth. Provider   cephALEXin (KEFLEX) 500 MG capsule Take 1 capsule (500 mg total) by mouth 2 (two) times daily. 14 capsule Mory Herrman, Linward Foster, MD   fluconazole (DIFLUCAN) 150 MG tablet Take 1 tablet (150 mg total) by mouth daily. 1 tablet Norval Gable, MD      1. Lab results and diagnosis reviewed with patient 2. rx as per orders above; reviewed possible side effects, interactions, risks and benefits  3. Recommend supportive treatment with increase water 4. Follow-up prn if symptoms worsen or don't improve  PDMP not reviewed this encounter.   Norval Gable, MD 02/12/20 1726

## 2020-02-14 LAB — URINE CULTURE
Culture: >=100000 — AB
Special Requests: NORMAL

## 2020-04-17 ENCOUNTER — Other Ambulatory Visit: Payer: Self-pay

## 2020-04-17 ENCOUNTER — Ambulatory Visit
Admission: EM | Admit: 2020-04-17 | Discharge: 2020-04-17 | Disposition: A | Discharge status: Home / Self Care | Payer: 59 | Attending: Internal Medicine | Admitting: Internal Medicine

## 2020-04-17 ENCOUNTER — Encounter: Payer: Self-pay | Admitting: Emergency Medicine

## 2020-04-17 DIAGNOSIS — J060 Acute laryngopharyngitis: Secondary | ICD-10-CM

## 2020-04-17 DIAGNOSIS — J019 Acute sinusitis, unspecified: Secondary | ICD-10-CM

## 2020-04-17 DIAGNOSIS — B9789 Other viral agents as the cause of diseases classified elsewhere: Secondary | ICD-10-CM

## 2020-04-17 MED ORDER — FLUTICASONE PROPIONATE 50 MCG/ACT NA SUSP: 1.0000 | Freq: Every day | NASAL | 0 refills | Status: DC

## 2020-04-17 MED ORDER — BENZONATATE 100 MG PO CAPS: 100.0000 mg | ORAL_CAPSULE | Freq: Three times a day (TID) | ORAL | 0 refills | Status: DC | PRN

## 2020-04-17 MED ORDER — AMOXICILLIN-POT CLAVULANATE 875-125 MG PO TABS: 1.0000 | ORAL_TABLET | Freq: Two times a day (BID) | ORAL | 0 refills | Status: AC

## 2020-04-17 NOTE — ED Provider Notes (Addendum)
[facility]    CSN: [id number] Arrival date & time: 04/17/20  2671      History   Chief Complaint Chief Complaint  Patient presents with  . Sinus Problem    HPI Heather Golden is a 54 y.o. female comes to urgent care with complaints of nasal congestion, postnasal drip, sinus pressure and loss of voice over the past week.  Patient says symptoms started insidiously and the symptoms have gotten worse.  She has a cough which is productive of greenish sputum.  No shortness of breath, wheezing or chest pain.  Patient symptoms are about the 7-day mark.  It appears that her nasal discharge and sputum production is worsening.  Patient is fully vaccinated against COVID-19 virus. HPI  Past Medical History:  Diagnosis Date  . Diabetes mellitus without complication (HCC)     There are no problems to display for this patient.   Past Surgical History:  Procedure Laterality Date  . BACK SURGERY    . GASTROPLASTY DUODENAL SWITCH      OB History   No obstetric history on file.      Home Medications    Prior to Admission medications   Medication Sig Start Date End Date Taking? Authorizing Provider  buPROPion (WELLBUTRIN XL) 150 MG 24 hr tablet Take 150 mg by mouth daily.   Yes [provider]  levonorgestrel (MIRENA) 20 MCG/24HR IUD 1 each by Intrauterine route once.   Yes [provider]  loratadine (CLARITIN) 10 MG tablet Take by mouth.   Yes [provider]  Multiple Vitamins-Minerals (MULTIVITAMIN WITH MINERALS) tablet Take 1 tablet by mouth daily.   Yes [provider]  phentermine (ADIPEX-P) 37.5 MG tablet Take 18.75 mg by mouth every morning. 12/18/19  Yes [provider]  benzonatate (TESSALON PERLES) 100 MG capsule Take 1 capsule (100 mg total) by mouth 3 (three) times daily as needed for cough. 04/17/20   LampteyBritta Mccreedy, MD  chlorpheniramine-HYDROcodone (TUSSIONEX PENNKINETIC ER) 10-8 MG/5ML SUER Take 5 mLs by mouth at  bedtime as needed for cough. do not drive or operate machinery while taking as can cause drowsiness. 11/11/17   Renford Dills, NP  etonogestrel-ethinyl estradiol (NUVARING) 0.12-0.015 MG/24HR vaginal ring Place 1 each vaginally every 28 (twenty-eight) days. Insert vaginally and leave in place for 3 consecutive weeks, then remove for 1 week.    [provider]  fluticasone (FLONASE) 50 MCG/ACT nasal spray Place 1 spray into both nostrils daily. 04/17/20   LampteyBritta Mccreedy, MD    Family History Family History  Problem Relation Age of Onset  . Heart failure Mother   . Diabetes Mother     Social History Social History   Tobacco Use  . Smoking status: Never Smoker  . Smokeless tobacco: Never Used  Vaping Use  . Vaping Use: Never used  Substance Use Topics  . Alcohol use: Yes    Comment: social  . Drug use: No     Allergies   Percocet [oxycodone-acetaminophen] and Septra [sulfamethoxazole-trimethoprim]   Review of Systems Review of Systems  Constitutional: Negative.  Negative for fatigue and fever.  HENT: Positive for congestion, postnasal drip, sinus pressure, sinus pain, sore throat and voice change.   Respiratory: Positive for cough. Negative for chest tightness and shortness of breath.   Cardiovascular: Negative for chest pain.     Physical Exam Triage Vital Signs ED Triage Vitals  Enc Vitals Group     BP 04/17/20 0907 (!) 99/54  Pulse Rate 04/17/20 0907 87     Resp 04/17/20 0907 16     Temp 04/17/20 0907 99 F (37.2 C)     Temp Source 04/17/20 0907 Oral     SpO2 04/17/20 0907 98 %     Weight 04/17/20 0903 201 lb (91.2 kg)     Height 04/17/20 0903 5\' 4"  (1.626 m)     Head Circumference --      Peak Flow --      Pain Score 04/17/20 0903 7     Pain Loc --      Pain Edu? --      Excl. in [place]? --    No data found.  Updated Vital Signs BP (!) 99/54 (BP Location: Right Arm)   Pulse 87   Temp 99 F (37.2 C) (Oral)   Resp 16   Ht 5\' 4"  (1.626 m)    Wt 91.2 kg   SpO2 98%   BMI 34.50 kg/m   Visual Acuity Right Eye Distance:   Left Eye Distance:   Bilateral Distance:    Right Eye Near:   Left Eye Near:    Bilateral Near:     Physical Exam Vitals and nursing note reviewed.  Constitutional:      General: She is not in acute distress.    Appearance: She is not ill-appearing.  HENT:     Right Ear: Tympanic membrane normal.     Left Ear: Tympanic membrane normal.     Mouth/Throat:     Pharynx: No posterior oropharyngeal erythema.  Cardiovascular:     Rate and Rhythm: Normal rate and regular rhythm.     Pulses: Normal pulses.     Heart sounds: Normal heart sounds.  Pulmonary:     Effort: Pulmonary effort is normal.     Breath sounds: Normal breath sounds.  Musculoskeletal:        General: Normal range of motion.  Skin:    General: Skin is warm.  Neurological:     Mental Status: She is alert.      UC Treatments / Results  Labs (all labs ordered are listed, but only abnormal results are displayed) Labs Reviewed - No data to display  EKG   Radiology No results found.  Procedures Procedures (including critical care time)  Medications Ordered in UC Medications - No data to display  Initial Impression / Assessment and Plan / UC Course  I have reviewed the triage vital signs and the nursing notes.  Pertinent labs & imaging results that were available during my care of the patient were reviewed by me and considered in my medical decision making (see chart for details).     1.  Acute sinusitis, probably viral: Patient will start using Flonase, saline nasal rinse and some Tessalon Perles as needed for cough Mucinex, loratadine should be continued. If patient symptoms does not improve over the next 2 to 3 days-she can pick up antibiotics sent to the pharmacy.  She is currently about 7 days since the symptoms started and she continues to have persistent symptoms with greenish cough. Final Clinical  Impressions(s) / UC Diagnoses   Final diagnoses:  Acute laryngopharyngitis  Acute viral sinusitis     Discharge Instructions     Optimize oral fluid intake Saline nasal rinse before bedtime is helpful If your symptoms worsens after a week of onset of the symptoms, please return to urgent care to be reevaluated.   ED Prescriptions    Medication Sig Dispense  Auth. Provider   fluticasone (FLONASE) 50 MCG/ACT nasal spray Place 1 spray into both nostrils daily. 16 g Amaurie Wandel, Britta Mccreedy, MD   benzonatate (TESSALON PERLES) 100 MG capsule Take 1 capsule (100 mg total) by mouth 3 (three) times daily as needed for cough. 30 capsule Tammi Boulier, Britta Mccreedy, MD     PDMP not reviewed this encounter.   Merrilee Jansky, MD 04/17/20 1059    Merrilee Jansky, MD 04/17/20 1102

## 2020-04-17 NOTE — ED Triage Notes (Signed)
Patient c/o sinus congestion and pressure and loss of voice for a week.  Patient denies fevers.  Patient states that she took a at home COVID test yesterday and was negative.

## 2020-04-17 NOTE — Discharge Instructions (Signed)
Optimize oral fluid intake Saline nasal rinse before bedtime is helpful If your symptoms worsens after a week of onset of the symptoms, please return to urgent care to be reevaluated.

## 2020-05-02 ENCOUNTER — Ambulatory Visit
Admission: EM | Admit: 2020-05-02 | Discharge: 2020-05-02 | Disposition: A | Discharge status: Home / Self Care | Payer: 59 | Attending: Emergency Medicine | Admitting: Emergency Medicine

## 2020-05-02 ENCOUNTER — Other Ambulatory Visit: Payer: Self-pay

## 2020-05-02 DIAGNOSIS — J029 Acute pharyngitis, unspecified: Secondary | ICD-10-CM

## 2020-05-02 DIAGNOSIS — N3000 Acute cystitis without hematuria: Secondary | ICD-10-CM | POA: Insufficient documentation

## 2020-05-02 DIAGNOSIS — J040 Acute laryngitis: Secondary | ICD-10-CM | POA: Insufficient documentation

## 2020-05-02 LAB — URINALYSIS, COMPLETE (UACMP) WITH MICROSCOPIC
Bilirubin Urine: NEGATIVE
Glucose, UA: NEGATIVE mg/dL
Hgb urine dipstick: NEGATIVE
Ketones, ur: NEGATIVE mg/dL
Nitrite: NEGATIVE
Protein, ur: NEGATIVE mg/dL
RBC / HPF: NONE SEEN RBC/hpf (ref 0–5)
Specific Gravity, Urine: 1.01 (ref 1.005–1.030)
pH: 5.5 (ref 5.0–8.0)

## 2020-05-02 LAB — GROUP A STREP BY PCR: Group A Strep by PCR: NOT DETECTED

## 2020-05-02 MED ORDER — PHENAZOPYRIDINE HCL 200 MG PO TABS: 200.0000 mg | ORAL_TABLET | Freq: Three times a day (TID) | ORAL | 0 refills | Status: DC | PRN

## 2020-05-02 MED ORDER — NITROFURANTOIN MONOHYD MACRO 100 MG PO CAPS: 100.0000 mg | ORAL_CAPSULE | Freq: Two times a day (BID) | ORAL | 0 refills | Status: DC

## 2020-05-02 NOTE — Discharge Instructions (Signed)
Finish the [employer], if you feel better.  Continue pushing fluids.  The Pyridium will help with your symptoms.  we will call you if we need to change your antibiotics.  I will contact you only if your strep is positive.  If you do not hear from me by tomorrow, you may assume that it is negative.  Make sure you drink plenty of extra fluids.  Some people find salt water gargles and  Tr[employer]'s "Throat Coat" tea helpful. Take 5 mL of liquid Benadryl and 5 mL of Maalox. Mix it together, and then hold it in your mouth for as long as you can and then swallow. You may do this 4 times a day.    Go to www.goodrx.com to look up your medications. This will give you a list of where you can find your prescriptions at the most affordable prices. Or ask the pharmacist what the cash price is, or if they have any other discount programs available to help make your medication more affordable. This can be less expensive than what you would pay with insurance.

## 2020-05-02 NOTE — ED Triage Notes (Addendum)
Pt states "I have a UTI" Awoke today with dysuria. Also was seen here 2 weeks ago for sinus infection but has laryngitis so wants to be re-evaluated. States she had a negative COVID test.

## 2020-05-02 NOTE — ED Provider Notes (Signed)
HPI  SUBJECTIVE:  Heather Golden is a 54 y.o. female who presents with 2 issues: First, she reports UTI symptoms starting this morning.  She reports lower abdominal pressure, urinary urgency, frequency.  She denies dysuria, cloudy odorous urine, hematuria.  No vomiting, fevers, abdominal, back, pelvic pain.  No vaginal bleeding, odor, discharge, rash.  She has multiple female partners, but states that she always use condoms.  She estimates that she has 5 current sexual partners.  They were all asymptomatic to her knowledge.  STDs are not a concern today she was on amoxicillin earlier this month for sinusitis.  She tried pushing fluids without improvement of symptoms.  No aggravating factors.    She also reports sore throat/laryngitis starting on 8/8.  She states that got better while she was on the amoxicillin, but returned 2 or 3 days ago.  She reports postnasal drip.  No fevers, sensation of throat swelling shut, drooling, trismus, neck stiffness, GERD symptoms, allergy symptoms or nasal congestion.  She has been exposed to somebody with strep.  She has tried Flonase, Claritin, gargling salt water and hot teas without improvement in her symptoms.  Symptoms worse with talking too much.  She has a past medical history of UTI.  Last urine culture was pansensitive E. coli UTI.  No history of diabetes, hypertension, frequent strep, STDs, or GERD.  PMD: [facility].    Past Medical History:  Diagnosis Date  . Diabetes mellitus without complication [facility])     Past Surgical History:  Procedure Laterality Date  . BACK SURGERY    . GASTROPLASTY DUODENAL SWITCH      Family History  Problem Relation Age of Onset  . Heart failure Mother   . Diabetes Mother     Social History   Tobacco Use  . Smoking status: Never Smoker  . Smokeless tobacco: Never Used  Vaping Use  . Vaping Use: Never used  Substance Use Topics  . Alcohol use: Yes    Comment: social  . Drug use: No    No  current facility-administered medications for this encounter.  Current Outpatient Medications:  .  benzonatate (TESSALON PERLES) 100 MG capsule, Take 1 capsule (100 mg total) by mouth 3 (three) times daily as needed for cough., Disp: 30 capsule, Rfl: 0 .  buPROPion (WELLBUTRIN XL) 150 MG 24 hr tablet, Take 150 mg by mouth daily., Disp: , Rfl:  .  fluticasone (FLONASE) 50 MCG/ACT nasal spray, Place 1 spray into both nostrils daily., Disp: 16 g, Rfl: 0 .  levonorgestrel (MIRENA) 20 MCG/24HR IUD, 1 each by Intrauterine route once., Disp: , Rfl:  .  loratadine (CLARITIN) 10 MG tablet, Take by mouth., Disp: , Rfl:  .  Multiple Vitamins-Minerals (MULTIVITAMIN WITH MINERALS) tablet, Take 1 tablet by mouth daily., Disp: , Rfl:  .  nitrofurantoin, macrocrystal-monohydrate, (MACROBID) 100 MG capsule, Take 1 capsule (100 mg total) by mouth 2 (two) times daily. X 5 days, Disp: 10 capsule, Rfl: 0 .  phenazopyridine (PYRIDIUM) 200 MG tablet, Take 1 tablet (200 mg total) by mouth 3 (three) times daily as needed for pain., Disp: 6 tablet, Rfl: 0 .  phentermine (ADIPEX-P) 37.5 MG tablet, Take 18.75 mg by mouth every morning., Disp: , Rfl:   Allergies  Allergen Reactions  . Percocet [Oxycodone-Acetaminophen] Itching  . Septra [Sulfamethoxazole-Trimethoprim] Swelling     ROS  As noted in HPI.   Physical Exam  BP 120/89 (BP Location: Right Arm)   Pulse 75   Temp  98.5 F (36.9 C) (Oral)   Resp 18   Ht 5\' 4"  (1.626 m)   Wt 91.2 kg   SpO2 99%   BMI 34.51 kg/m   Constitutional: Well developed, well nourished, no acute distress Eyes:  EOMI, conjunctiva normal bilaterally HENT: Normocephalic, atraumatic,mucus membranes moist.  Positive mild nasal congestion.  No sinus tenderness.  Erythematous oropharynx.  Positive cobblestoning, postnasal drip.  Tonsils normal without exudates.  Uvula midline. Neck: No cervical lymphadenopathy Respiratory: Normal inspiratory effort Cardiovascular: Normal rate, no  murmurs GI: nondistended.  Positive suprapubic tenderness.  No flank tenderness.  No splenomegaly. Neck: No CVAT. skin: No rash, skin intact Musculoskeletal: no deformities Neurologic: Alert & oriented x 3, no focal neuro deficits Psychiatric: Speech and behavior appropriate   ED Course   Medications - No data to display  Orders Placed This Encounter  Procedures  . Urine culture    Standing Status:   Standing    Number of Occurrences:   1    Order Specific Question:   List patient's active antibiotics    Answer:   macrobid  . Group A Strep by PCR    Standing Status:   Standing    Number of Occurrences:   1  . Urinalysis, Complete w Microscopic    Standing Status:   Standing    Number of Occurrences:   1    Results for orders placed or performed during the hospital encounter of 05/02/20 (from the past 24 hour(s))  Urinalysis, Complete w Microscopic Urine, Clean Catch     Status: Abnormal   Collection Time: 05/02/20 10:52 AM  Result Value Ref Range   Color, Urine YELLOW YELLOW   APPearance HAZY (A) CLEAR   Specific Gravity, Urine 1.010 1.005 - 1.030   pH 5.5 5.0 - 8.0   Glucose, UA NEGATIVE NEGATIVE mg/dL   Hgb urine dipstick NEGATIVE NEGATIVE   Bilirubin Urine NEGATIVE NEGATIVE   Ketones, ur NEGATIVE NEGATIVE mg/dL   Protein, ur NEGATIVE NEGATIVE mg/dL   Nitrite NEGATIVE NEGATIVE   Leukocytes,Ua TRACE (A) NEGATIVE   Squamous Epithelial / LPF 0-5 0 - 5   WBC, UA 6-10 0 - 5 WBC/hpf   RBC / HPF NONE SEEN 0 - 5 RBC/hpf   Bacteria, UA FEW (A) NONE SEEN  Group A Strep by PCR     Status: None   Collection Time: 05/02/20 12:15 PM   Specimen: Throat; Sterile Swab  Result Value Ref Range   Group A Strep by PCR NOT DETECTED NOT DETECTED   No results found.  ED Clinical Impression  1. Acute cystitis without hematuria   2. Laryngitis   3. Pharyngitis, unspecified etiology      ED Assessment/Plan  1.  UTI: H&P, UA consistent with urinary tract infection with few  bacteria, trace leukocytes and some pyuria.  This is a clean sample.  Given that she has frequent UTIs will send home with Macrobid, Pyridium.  Sending urine off for culture to confirm antibiotic choice.  Patient states that STDs are not a concern today.  Deferred STD testing, wet prep.  2.  Pharyngitis/laryngitis.  Could be acid reflux, could be from postnasal drip.  Will check strep PCR.  Will call patient at [phone] only if positive.  We will have her continue hot tea, honey, Benadryl/Maalox mixture, Flonase, start saline nasal irrigation with a [phone] med rinse and distilled water as often as she wants.  Follow-up with PMD as needed  Strep negative.  Discussed labs, MDM,  treatment plan, and plan for follow-up with patient. Discussed sn/sx that should prompt return to the ED. patient agrees with plan.   Meds ordered this encounter  Medications  . nitrofurantoin, macrocrystal-monohydrate, (MACROBID) 100 MG capsule    Sig: Take 1 capsule (100 mg total) by mouth 2 (two) times daily. X 5 days    Dispense:  10 capsule    Refill:  0  . phenazopyridine (PYRIDIUM) 200 MG tablet    Sig: Take 1 tablet (200 mg total) by mouth 3 (three) times daily as needed for pain.    Dispense:  6 tablet    Refill:  0    *This clinic note was created using Scientist, clinical (histocompatibility and immunogenetics). Therefore, there may be occasional mistakes despite careful proofreading.   ?    Domenick Gong, MD 05/04/20 1404

## 2020-05-03 LAB — URINE CULTURE: Culture: <10000 — AB

## 2020-09-11 ENCOUNTER — Ambulatory Visit
Admission: EM | Admit: 2020-09-11 | Discharge: 2020-09-11 | Disposition: A | Discharge status: Home / Self Care | Payer: 59 | Attending: Physician Assistant | Admitting: Physician Assistant

## 2020-09-11 ENCOUNTER — Encounter: Payer: Self-pay | Admitting: Emergency Medicine

## 2020-09-11 ENCOUNTER — Other Ambulatory Visit: Payer: Self-pay

## 2020-09-11 DIAGNOSIS — R300 Dysuria: Secondary | ICD-10-CM | POA: Diagnosis not present

## 2020-09-11 DIAGNOSIS — N3001 Acute cystitis with hematuria: Secondary | ICD-10-CM | POA: Diagnosis not present

## 2020-09-11 LAB — URINALYSIS, COMPLETE (UACMP) WITH MICROSCOPIC
Bilirubin Urine: NEGATIVE
Glucose, UA: NEGATIVE mg/dL
Ketones, ur: NEGATIVE mg/dL
Nitrite: NEGATIVE
Protein, ur: NEGATIVE mg/dL
Specific Gravity, Urine: 1.02 (ref 1.005–1.030)
pH: 6 (ref 5.0–8.0)

## 2020-09-11 MED ORDER — NITROFURANTOIN MONOHYD MACRO 100 MG PO CAPS: 100.0000 mg | ORAL_CAPSULE | Freq: Two times a day (BID) | ORAL | 0 refills | Status: AC

## 2020-09-11 NOTE — ED Triage Notes (Signed)
Pt c/o of painful and frequent urination x 1 day.

## 2020-09-11 NOTE — Discharge Instructions (Addendum)

## 2020-09-11 NOTE — ED Provider Notes (Signed)
[facility]    CSN: [id number] Arrival date & time: 09/11/20  1406      History   Chief Complaint Chief Complaint  Patient presents with  . Dysuria    HPI Heather Golden is a 55 y.o. female presenting for dysuria and urinary frequency x 1 day. Patient says she has history of UTIs and believes she has a current UTI.  She denies any fever, fatigue, abdominal pain, back pain, hematuria, vaginal discharge.  Patient states that she was last treated for UTI in August August 2021.  Patient states that she took Macrobid and that medication works best for her.  Has not taken any over-the-counter medication for symptoms.  Past medical history is significant for diabetes.  She has no other complaints or concerns.  HPI  Past Medical History:  Diagnosis Date  . Diabetes mellitus without complication (HCC)     There are no problems to display for this patient.   Past Surgical History:  Procedure Laterality Date  . BACK SURGERY    . GASTROPLASTY DUODENAL SWITCH      OB History   No obstetric history on file.      Home Medications    Prior to Admission medications   Medication Sig Start Date End Date Taking? Authorizing Provider  buPROPion (WELLBUTRIN XL) 150 MG 24 hr tablet Take 150 mg by mouth daily.   Yes [provider]  Multiple Vitamins-Minerals (MULTIVITAMIN WITH MINERALS) tablet Take 1 tablet by mouth daily.   Yes [provider]  nitrofurantoin, macrocrystal-monohydrate, (MACROBID) 100 MG capsule Take 1 capsule (100 mg total) by mouth 2 (two) times daily for 5 days. 09/11/20 09/16/20 Yes Shirlee Latch, PA-C  phentermine (ADIPEX-P) 37.5 MG tablet Take 18.75 mg by mouth every morning. 12/18/19  Yes [provider]  benzonatate (TESSALON PERLES) 100 MG capsule Take 1 capsule (100 mg total) by mouth 3 (three) times daily as needed for cough. 04/17/20   Merrilee Jansky, MD  fluticasone (FLONASE) 50 MCG/ACT nasal spray Place 1 spray into both  nostrils daily. 04/17/20   LampteyBritta Mccreedy, MD  levonorgestrel (MIRENA) 20 MCG/24HR IUD 1 each by Intrauterine route once.    [provider]  loratadine (CLARITIN) 10 MG tablet Take by mouth.    [provider]  phenazopyridine (PYRIDIUM) 200 MG tablet Take 1 tablet (200 mg total) by mouth 3 (three) times daily as needed for pain. 05/02/20   Domenick Gong, MD  etonogestrel-ethinyl estradiol (NUVARING) 0.12-0.015 MG/24HR vaginal ring Place 1 each vaginally every 28 (twenty-eight) days. Insert vaginally and leave in place for 3 consecutive weeks, then remove for 1 week.  05/02/20  [provider]    Family History Family History  Problem Relation Age of Onset  . Heart failure Mother   . Diabetes Mother     Social History Social History   Tobacco Use  . Smoking status: Never Smoker  . Smokeless tobacco: Never Used  Vaping Use  . Vaping Use: Never used  Substance Use Topics  . Alcohol use: Yes    Comment: social  . Drug use: No     Allergies   Percocet [oxycodone-acetaminophen] and Septra [sulfamethoxazole-trimethoprim]   Review of Systems Review of Systems  Constitutional: Negative for chills and fever.  Gastrointestinal: Negative for abdominal pain, diarrhea, nausea and vomiting.  Genitourinary: Positive for dysuria, frequency and urgency. Negative for decreased urine volume, flank pain, hematuria, pelvic pain, vaginal bleeding, vaginal discharge and vaginal pain.  Musculoskeletal: Negative  for back pain.  Skin: Negative for rash.     Physical Exam Triage Vital Signs ED Triage Vitals  Enc Vitals Group     BP 09/11/20 1454 135/90     Pulse Rate 09/11/20 1454 78     Resp 09/11/20 1454 18     Temp 09/11/20 1454 98.3 F (36.8 C)     Temp src --      SpO2 09/11/20 1454 100 %     Weight 09/11/20 1448 200 lb (90.7 kg)     Height 09/11/20 1448 5\' 4"  (1.626 m)     Head Circumference --      Peak Flow --      Pain Score 09/11/20 1446 7      Pain Loc --      Pain Edu? --      Excl. in GC? --    No data found.  Updated Vital Signs BP 135/90 (BP Location: Right Arm)   Pulse 78   Temp 98.3 F (36.8 C)   Resp 18   Ht 5\' 4"  (1.626 m)   Wt 200 lb (90.7 kg)   SpO2 100%   BMI 34.33 kg/m       Physical Exam Vitals and nursing note reviewed.  Constitutional:      General: She is not in acute distress.    Appearance: Normal appearance. She is not ill-appearing or toxic-appearing.  HENT:     Head: Normocephalic and atraumatic.  Eyes:     General: No scleral icterus.       Right eye: No discharge.        Left eye: No discharge.     Conjunctiva/sclera: Conjunctivae normal.  Cardiovascular:     Rate and Rhythm: Normal rate and regular rhythm.     Heart sounds: Normal heart sounds.  Pulmonary:     Effort: Pulmonary effort is normal. No respiratory distress.     Breath sounds: Normal breath sounds.  Abdominal:     Palpations: Abdomen is soft.     Tenderness: There is abdominal tenderness (mild suprapubic). There is no right CVA tenderness or left CVA tenderness.  Musculoskeletal:     Cervical back: Neck supple.  Skin:    General: Skin is dry.  Neurological:     General: No focal deficit present.     Mental Status: She is alert. Mental status is at baseline.     Motor: No weakness.     Gait: Gait normal.  Psychiatric:        Mood and Affect: Mood normal.        Behavior: Behavior normal.        Thought Content: Thought content normal.      UC Treatments / Results  Labs (all labs ordered are listed, but only abnormal results are displayed) Labs Reviewed  URINALYSIS, COMPLETE (UACMP) WITH MICROSCOPIC - Abnormal; Notable for the following components:      Result Value   Hgb urine dipstick SMALL (*)    Leukocytes,Ua MODERATE (*)    Bacteria, UA MANY (*)    All other components within normal limits  URINE CULTURE    EKG   Radiology No results found.  Procedures Procedures (including critical care  time)  Medications Ordered in UC Medications - No data to display  Initial Impression / Assessment and Plan / UC Course  I have reviewed the triage vital signs and the nursing notes.  Pertinent labs & imaging results that were available during my care  of the patient were reviewed by me and considered in my medical decision making (see chart for details).   Urinalysis significant for moderate leukocytes and small blood.  We will send urine for culture and treat for UTI.  Sending Macrobid.  Advised supportive care with increasing rest and fluids.  Return and ED precautions reviewed with patient.   Final Clinical Impressions(s) / UC Diagnoses   Final diagnoses:  Acute cystitis with hematuria  Dysuria     Discharge Instructions     UTI: Based on either symptoms or urinalysis, you may have a urinary tract infection. We will send the urine for culture and call with results in a few days. Begin antibiotics at this time. Your symptoms should be much improved over the next 2-3 days. Increase rest and fluid intake. If for some reason symptoms are worsening or not improving after a couple of days or the urine culture determines the antibiotics you are taking will not treat the infection, the antibiotics may be changed. Return or go to ER for fever, back pain, worsening urinary pain, discharge, increased blood in urine. May take Tylenol or Motrin OTC for pain relief or consider AZO if no contraindications     ED Prescriptions    Medication Sig Dispense Auth. Provider   nitrofurantoin, macrocrystal-monohydrate, (MACROBID) 100 MG capsule Take 1 capsule (100 mg total) by mouth 2 (two) times daily for 5 days. 10 capsule Shirlee Latch, PA-C     PDMP not reviewed this encounter.   Shirlee Latch, PA-C 09/11/20 1535

## 2020-09-14 LAB — URINE CULTURE: Culture: >=100000 — AB

## 2020-12-13 ENCOUNTER — Encounter: Payer: Self-pay | Admitting: Emergency Medicine

## 2020-12-13 ENCOUNTER — Ambulatory Visit
Admission: EM | Admit: 2020-12-13 | Discharge: 2020-12-13 | Disposition: A | Discharge status: Home / Self Care | Payer: 59 | Attending: Family Medicine | Admitting: Family Medicine

## 2020-12-13 ENCOUNTER — Other Ambulatory Visit: Payer: Self-pay

## 2020-12-13 DIAGNOSIS — N3000 Acute cystitis without hematuria: Secondary | ICD-10-CM | POA: Insufficient documentation

## 2020-12-13 LAB — URINALYSIS, COMPLETE (UACMP) WITH MICROSCOPIC
Glucose, UA: NEGATIVE mg/dL
Hgb urine dipstick: NEGATIVE
Nitrite: NEGATIVE
Protein, ur: NEGATIVE mg/dL
Specific Gravity, Urine: >1.03 — ABNORMAL HIGH (ref 1.005–1.030)
pH: 5 (ref 5.0–8.0)

## 2020-12-13 MED ORDER — CEPHALEXIN 500 MG PO CAPS: 500.0000 mg | ORAL_CAPSULE | Freq: Two times a day (BID) | ORAL | 0 refills | Status: DC

## 2020-12-13 MED ORDER — NITROFURANTOIN MONOHYD MACRO 100 MG PO CAPS: 100.0000 mg | ORAL_CAPSULE | Freq: Two times a day (BID) | ORAL | 0 refills | Status: DC

## 2020-12-13 NOTE — ED Triage Notes (Signed)
Patient in today c/o urinary frequency, urgency and burning x 2 days. Patient denies vaginal discharge. Patient drank cranberry juice and AZO without relief. Patient denies fever.

## 2020-12-13 NOTE — ED Provider Notes (Signed)
[facility]    CSN: [id number] Arrival date & time: 12/13/20  0859      History   Chief Complaint Chief Complaint  Patient presents with  . Urinary Urgency  . Urinary Frequency  . Dysuria   HPI  55 year old female presents with urinary symptoms.  2-day history of symptoms.  She reports urinary frequency, urgency, dysuria.  Denies vaginal discharge.  She has taken Azo without relief.  No fever.  No abdominal pain.  No back pain.  No other complaints.  Past Medical History:  Diagnosis Date  . Diabetes mellitus without complication (HCC)    controlled - no meds   Past Surgical History:  Procedure Laterality Date  . BACK SURGERY    . GASTROPLASTY DUODENAL SWITCH      OB History   No obstetric history on file.      Home Medications    Prior to Admission medications   Medication Sig Start Date End Date Taking? Authorizing Provider  buPROPion (WELLBUTRIN XL) 150 MG 24 hr tablet Take 150 mg by mouth daily.   Yes [provider]  Cranberry-Vitamin C (AZO CRANBERRY URINARY TRACT PO) Take 1 tablet by mouth daily.   Yes [provider]  fluticasone (FLONASE) 50 MCG/ACT nasal spray Place 1 spray into both nostrils daily. 04/17/20  Yes Lamptey, Britta Mccreedy, MD  levonorgestrel (MIRENA) 20 MCG/24HR IUD 1 each by Intrauterine route once.   Yes [provider]  Multiple Vitamins-Minerals (MULTIVITAMIN WITH MINERALS) tablet Take 1 tablet by mouth daily.   Yes [provider]  nitrofurantoin, macrocrystal-monohydrate, (MACROBID) 100 MG capsule Take 1 capsule (100 mg total) by mouth 2 (two) times daily. 12/13/20  Yes Koula Venier G, DO  phentermine (ADIPEX-P) 37.5 MG tablet Take 18.75 mg by mouth every morning. 12/18/19  Yes [provider]  etonogestrel-ethinyl estradiol (NUVARING) 0.12-0.015 MG/24HR vaginal ring Place 1 each vaginally every 28 (twenty-eight) days. Insert vaginally and leave in place for 3 consecutive weeks, then remove  for 1 week.  05/02/20  [provider]  loratadine (CLARITIN) 10 MG tablet Take by mouth.  12/13/20  [provider]    Family History Family History  Problem Relation Age of Onset  . Heart failure Mother   . Diabetes Mother   . Other Father        unknwon medical history    Social History Social History   Tobacco Use  . Smoking status: Never Smoker  . Smokeless tobacco: Never Used  Vaping Use  . Vaping Use: Never used  Substance Use Topics  . Alcohol use: Yes    Comment: social  . Drug use: No     Allergies   Sulfamethoxazole-trimethoprim and Percocet [oxycodone-acetaminophen]   Review of Systems Review of Systems Per HPI  Physical Exam Triage Vital Signs ED Triage Vitals  Enc Vitals Group     BP 12/13/20 0910 114/79     Pulse Rate 12/13/20 0910 78     Resp 12/13/20 0910 18     Temp 12/13/20 0910 98.2 F (36.8 C)     Temp Source 12/13/20 0910 Oral     SpO2 12/13/20 0910 98 %     Weight 12/13/20 0910 200 lb (90.7 kg)     Height 12/13/20 0910 5\' 4"  (1.626 m)     Head Circumference --      Peak Flow --      Pain Score 12/13/20 0909 8     Pain Loc --  Pain Edu? --      Excl. in GC? --    Updated Vital Signs BP 114/79 (BP Location: Left Arm)   Pulse 78   Temp 98.2 F (36.8 C) (Oral)   Resp 18   Ht 5\' 4"  (1.626 m)   Wt 90.7 kg   SpO2 98%   BMI 34.33 kg/m   Visual Acuity Right Eye Distance:   Left Eye Distance:   Bilateral Distance:    Right Eye Near:   Left Eye Near:    Bilateral Near:     Physical Exam Vitals and nursing note reviewed.  Constitutional:      General: She is not in acute distress.    Appearance: Normal appearance. She is obese. She is not ill-appearing.  HENT:     Head: Normocephalic and atraumatic.  Eyes:     General:        Right eye: No discharge.        Left eye: No discharge.     Conjunctiva/sclera: Conjunctivae normal.  Cardiovascular:     Rate and Rhythm: Normal rate and regular rhythm.   Pulmonary:     Effort: Pulmonary effort is normal.     Breath sounds: Normal breath sounds. No wheezing, rhonchi or rales.  Abdominal:     General: There is no distension.     Palpations: Abdomen is soft.     Tenderness: There is no abdominal tenderness.  Neurological:     Mental Status: She is alert.  Psychiatric:        Mood and Affect: Mood normal.        Behavior: Behavior normal.    UC Treatments / Results  Labs (all labs ordered are listed, but only abnormal results are displayed) Labs Reviewed  URINALYSIS, COMPLETE (UACMP) WITH MICROSCOPIC    EKG   Radiology No results found.  Procedures Procedures (including critical care time)  Medications Ordered in UC Medications - No data to display  Initial Impression / Assessment and Plan / UC Course  I have reviewed the triage vital signs and the nursing notes.  Pertinent labs & imaging results that were available during my care of the patient were reviewed by me and considered in my medical decision making (see chart for details).    55 year old female presents UTI.  Sending culture.  Placing on Macrobid.  Final Clinical Impressions(s) / UC Diagnoses   Final diagnoses:  Acute cystitis without hematuria   Discharge Instructions   None    ED Prescriptions    Medication Sig Dispense Auth. Provider   cephALEXin (KEFLEX) 500 MG capsule  (Status: Discontinued) Take 1 capsule (500 mg total) by mouth 2 (two) times daily. 14 capsule Bern Fare G, DO   nitrofurantoin, macrocrystal-monohydrate, (MACROBID) 100 MG capsule Take 1 capsule (100 mg total) by mouth 2 (two) times daily. 14 capsule 10-08-2003 G, DO     PDMP not reviewed this encounter.   Everlene Other, DO 12/13/20 1051

## 2021-05-21 ENCOUNTER — Ambulatory Visit
Admission: EM | Admit: 2021-05-21 | Discharge: 2021-05-21 | Disposition: A | Discharge status: Home / Self Care | Payer: 59 | Attending: Medical Oncology | Admitting: Medical Oncology

## 2021-05-21 ENCOUNTER — Encounter: Payer: Self-pay | Admitting: Emergency Medicine

## 2021-05-21 ENCOUNTER — Other Ambulatory Visit: Payer: Self-pay

## 2021-05-21 DIAGNOSIS — W57XXXA Bitten or stung by nonvenomous insect and other nonvenomous arthropods, initial encounter: Secondary | ICD-10-CM | POA: Diagnosis not present

## 2021-05-21 DIAGNOSIS — L03032 Cellulitis of left toe: Secondary | ICD-10-CM | POA: Diagnosis not present

## 2021-05-21 DIAGNOSIS — S90862A Insect bite (nonvenomous), left foot, initial encounter: Secondary | ICD-10-CM | POA: Diagnosis not present

## 2021-05-21 MED ORDER — FLUCONAZOLE 150 MG PO TABS: 150.0000 mg | ORAL_TABLET | Freq: Every day | ORAL | 0 refills | Status: AC

## 2021-05-21 MED ORDER — DOXYCYCLINE HYCLATE 100 MG PO CAPS: 100.0000 mg | ORAL_CAPSULE | Freq: Two times a day (BID) | ORAL | 0 refills | Status: DC

## 2021-05-21 MED ORDER — PREDNISONE 10 MG (21) PO TBPK: ORAL_TABLET | Freq: Every day | ORAL | 0 refills | Status: AC

## 2021-05-21 NOTE — ED Provider Notes (Addendum)
[facility]    CSN: [id number] Arrival date & time: 05/21/21  1337      History   Chief Complaint Chief Complaint  Patient presents with   Insect Bite    Both feet    HPI Heather Golden is a 55 y.o. female.   HPI  Insect Bites: Pt reports that on Friday she was bitten by fire ants on her feet. She has had redness, swelling, pain in bilateral feet. She also has noticed some yellow drainage from her left big toe which prompted the visit to our clinic today. She denies fevers, SOB, troubles breathing. She has tried benadryl which has helped some.   Past Medical History:  Diagnosis Date   Diabetes mellitus without complication (HCC)    controlled - no meds    There are no problems to display for this patient.   Past Surgical History:  Procedure Laterality Date   BACK SURGERY     GASTROPLASTY DUODENAL SWITCH      OB History   No obstetric history on file.      Home Medications    Prior to Admission medications   Medication Sig Start Date End Date Taking? Authorizing Provider  buPROPion (WELLBUTRIN XL) 150 MG 24 hr tablet Take 150 mg by mouth daily.   Yes [provider]  levonorgestrel (MIRENA) 20 MCG/24HR IUD 1 each by Intrauterine route once.   Yes [provider]  Multiple Vitamins-Minerals (MULTIVITAMIN WITH MINERALS) tablet Take 1 tablet by mouth daily.   Yes [provider]  phentermine (ADIPEX-P) 37.5 MG tablet Take 18.75 mg by mouth every morning. 12/18/19  Yes [provider]  Cranberry-Vitamin C (AZO CRANBERRY URINARY TRACT PO) Take 1 tablet by mouth daily.    [provider]  fluticasone (FLONASE) 50 MCG/ACT nasal spray Place 1 spray into both nostrils daily. 04/17/20   Lamptey, Britta Mccreedy, MD  nitrofurantoin, macrocrystal-monohydrate, (MACROBID) 100 MG capsule Take 1 capsule (100 mg total) by mouth 2 (two) times daily. 12/13/20   Tommie Sams, DO  etonogestrel-ethinyl estradiol (NUVARING) 0.12-0.015  MG/24HR vaginal ring Place 1 each vaginally every 28 (twenty-eight) days. Insert vaginally and leave in place for 3 consecutive weeks, then remove for 1 week.  05/02/20  [provider]  loratadine (CLARITIN) 10 MG tablet Take by mouth.  12/13/20  [provider]    Family History Family History  Problem Relation Age of Onset   Heart failure Mother    Diabetes Mother    Other Father        unknwon medical history    Social History Social History   Tobacco Use   Smoking status: Never   Smokeless tobacco: Never  Vaping Use   Vaping Use: Never used  Substance Use Topics   Alcohol use: Yes    Comment: social   Drug use: No     Allergies   Sulfamethoxazole-trimethoprim and Percocet [oxycodone-acetaminophen]   Review of Systems Review of Systems  As stated above in HPI Physical Exam Triage Vital Signs ED Triage Vitals  Enc Vitals Group     BP 05/21/21 1350 (!) 121/95     Pulse Rate 05/21/21 1350 78     Resp 05/21/21 1350 14     Temp 05/21/21 1350 98.7 F (37.1 C)     Temp Source 05/21/21 1350 Oral     SpO2 05/21/21 1350 98 %     Weight 05/21/21 1347 199 lb 15.3 oz (90.7 kg)  Height 05/21/21 1347 5\' 4"  (1.626 m)     Head Circumference --      Peak Flow --      Pain Score 05/21/21 1346 2     Pain Loc --      Pain Edu? --      Excl. in GC? --    No data found.  Updated Vital Signs BP (!) 121/95 (BP Location: Right Arm)   Pulse 78   Temp 98.7 F (37.1 C) (Oral)   Resp 14   Ht 5\' 4"  (1.626 m)   Wt 199 lb 15.3 oz (90.7 kg)   SpO2 98%   BMI 34.32 kg/m   Physical Exam Vitals and nursing note reviewed.  Constitutional:      Appearance: Normal appearance.  HENT:     Mouth/Throat:     Pharynx: Oropharynx is clear.  Cardiovascular:     Rate and Rhythm: Normal rate and regular rhythm.     Heart sounds: Normal heart sounds.  Pulmonary:     Effort: Pulmonary effort is normal.     Breath sounds: Normal breath sounds.  Musculoskeletal:         General: Swelling (mild of bilateral distal feet with scant erythema) present. No tenderness. Normal range of motion.  Skin:    Findings: Rash (scattered bites of bilateral feet) present.  Neurological:     Mental Status: She is alert and oriented to person, place, and time.     Sensory: No sensory deficit.     UC Treatments / Results  Labs (all labs ordered are listed, but only abnormal results are displayed) Labs Reviewed - No data to display  EKG   Radiology No results found.  Procedures Procedures (including critical care time)  Medications Ordered in UC Medications - No data to display  Initial Impression / Assessment and Plan / UC Course  I have reviewed the triage vital signs and the nursing notes.  Pertinent labs & imaging results that were available during my care of the patient were reviewed by me and considered in my medical decision making (see chart for details).     New.  Treating for allergic reaction and also cellulitis secondary to the insect bites.  Treating with doxycycline and prednisone which she has tolerated well in the past.  We discussed how to use these medications along with common potential side effects and precautions.  She would like a Diflucan pill as she is going out of town and tends to get yeast infections with antibiotics.  I have sent this in for her.  We discussed the Tdap today but she elected to hold off at this time. Discussed red flag signs and symptoms.  Final Clinical Impressions(s) / UC Diagnoses   Final diagnoses:  None   Discharge Instructions   None    ED Prescriptions   None    PDMP not reviewed this encounter.   05/23/21, PA-C 05/21/21 1412    Rushie Chestnut, PA-C 05/21/21 [phone]

## 2021-05-21 NOTE — ED Triage Notes (Signed)
Patient c/o swelling, redness, and pain in both her feet and left big toe.  Patient states she got bit by fire ants on Friday. Patient has some yellow drainage from her left big toe.

## 2023-04-02 ENCOUNTER — Ambulatory Visit
Admission: EM | Admit: 2023-04-02 | Discharge: 2023-04-02 | Disposition: A | Discharge status: Home / Self Care | Payer: No Typology Code available for payment source | Attending: Emergency Medicine | Admitting: Emergency Medicine

## 2023-04-02 DIAGNOSIS — R300 Dysuria: Secondary | ICD-10-CM

## 2023-04-02 DIAGNOSIS — N390 Urinary tract infection, site not specified: Secondary | ICD-10-CM

## 2023-04-02 LAB — URINALYSIS, W/ REFLEX TO CULTURE (INFECTION SUSPECTED)
Ketones, ur: NEGATIVE mg/dL
WBC, UA: >50 WBC/hpf (ref 0–5)

## 2023-04-02 MED ORDER — CIPROFLOXACIN HCL 500 MG PO TABS: 500.0000 mg | ORAL_TABLET | Freq: Two times a day (BID) | ORAL | 0 refills | Status: AC

## 2023-04-02 NOTE — ED Triage Notes (Signed)
Pt c/o urinary freq,burning & pain x4 days. Denies any hematuria. Has tried AZO w/o relief.

## 2023-04-02 NOTE — Discharge Instructions (Addendum)
We are treating you for UTI , take Cipro as directed , as with any antibiotic can cause upset stomach and photosensitivity. Rest,push fluids,drink plenty of water. Avoid cola and caffeine.   Avoid tub baths, void after sex, make sure you are wiping well from front to back.  Please follow-up with urologist for further evaluation of recurrent UTIs-call for appointment.

## 2023-04-02 NOTE — ED Provider Notes (Signed)
[facility]    CSN: [id number] Arrival date & time: 04/02/23  4782      History   Chief Complaint Chief Complaint  Patient presents with   Dysuria    HPI Heather Golden is a 57 y.o. female.   57 year old female, Heather Golden, presents to urgent care for evaluation of burning with urination,frequency x 4 days.  Patient states she has been using Azo without relief.  Had UTI earlier this month and took Macrobid, felt like she improved, but now has symptoms returned.    Patient has past medical history of gastric bypass  The history is provided by the patient. No language interpreter was used.    Past Medical History:  Diagnosis Date   Diabetes mellitus without complication (HCC)    controlled - no meds    There are no problems to display for this patient.   Past Surgical History:  Procedure Laterality Date   BACK SURGERY     GASTROPLASTY DUODENAL SWITCH      OB History   No obstetric history on file.      Home Medications    Prior to Admission medications   Medication Sig Start Date End Date Taking? Authorizing Provider  buPROPion (WELLBUTRIN XL) 150 MG 24 hr tablet Take 150 mg by mouth daily.   Yes [provider]  Cranberry-Vitamin C (AZO CRANBERRY URINARY TRACT PO) Take 1 tablet by mouth daily.   Yes [provider]  levonorgestrel (MIRENA) 20 MCG/24HR IUD 1 each by Intrauterine route once.   Yes [provider]  Multiple Vitamins-Minerals (MULTIVITAMIN WITH MINERALS) tablet Take 1 tablet by mouth daily.   Yes [provider]  phentermine (ADIPEX-P) 37.5 MG tablet Take 18.75 mg by mouth every morning. 12/18/19  Yes [provider]  QUEtiapine (SEROQUEL) 25 MG tablet Take 25 mg by mouth at bedtime. 01/11/23  Yes [provider]  tinidazole (TINDAMAX) 500 MG tablet Take 2,000 mg by mouth once. 01/25/23  Yes [provider]  WEGOVY 2.4 MG/0.75ML SOAJ Inject into the skin. 03/31/23  Yes  [provider]  doxycycline (VIBRAMYCIN) 100 MG capsule Take 1 capsule (100 mg total) by mouth 2 (two) times daily. 05/21/21   Rushie Chestnut, PA-C  fluconazole (DIFLUCAN) 150 MG tablet Take 1 tablet (150 mg total) by mouth daily. 05/21/21   Rushie Chestnut, PA-C  predniSONE (STERAPRED UNI-PAK 21 TAB) 10 MG (21) TBPK tablet Take by mouth daily. Take 6 tabs by mouth daily  for 2 days, then 5 tabs for 2 days, then 4 tabs for 2 days, then 3 tabs for 2 days, 2 tabs for 2 days, then 1 tab by mouth daily for 2 days 05/21/21   Rushie Chestnut, PA-C  etonogestrel-ethinyl estradiol (NUVARING) 0.12-0.015 MG/24HR vaginal ring Place 1 each vaginally every 28 (twenty-eight) days. Insert vaginally and leave in place for 3 consecutive weeks, then remove for 1 week.  05/02/20  [provider]  loratadine (CLARITIN) 10 MG tablet Take by mouth.  12/13/20  [provider]    Family History Family History  Problem Relation Age of Onset   Heart failure Mother    Diabetes Mother    Other Father        unknwon medical history    Social History Social History   Tobacco Use   Smoking status: Never   Smokeless tobacco: Never  Vaping Use   Vaping status: Never Used  Substance Use Topics   Alcohol  use: Yes    Comment: social   Drug use: No     Allergies   Sulfamethoxazole-trimethoprim and Percocet [oxycodone-acetaminophen]   Review of Systems Review of Systems  Constitutional:  Negative for fever.  Genitourinary:  Positive for dysuria and frequency.  Musculoskeletal:        Negative CVAT  All other systems reviewed and are negative.    Physical Exam Triage Vital Signs ED Triage Vitals  Encounter Vitals Group     BP 04/02/23 0829 111/81     Systolic BP Percentile --      Diastolic BP Percentile --      Pulse Rate 04/02/23 0829 75     Resp 04/02/23 0829 16     Temp 04/02/23 0829 98.7 F (37.1 C)     Temp Source 04/02/23 0829 Oral     SpO2 04/02/23 0829 100  %     Weight 04/02/23 0828 120 lb (54.4 kg)     Height 04/02/23 0828 5\' 4"  (1.626 m)     Head Circumference --      Peak Flow --      Pain Score 04/02/23 0833 7     Pain Loc --      Pain Education --      Exclude from Growth Chart --    No data found.  Updated Vital Signs BP 111/81 (BP Location: Left Arm)   Pulse 75   Temp 98.7 F (37.1 C) (Oral)   Resp 16   Ht 5\' 4"  (1.626 m)   Wt 120 lb (54.4 kg)   SpO2 100%   BMI 20.60 kg/m   Visual Acuity Right Eye Distance:   Left Eye Distance:   Bilateral Distance:    Right Eye Near:   Left Eye Near:    Bilateral Near:     Physical Exam Vitals and nursing note reviewed.  Constitutional:      General: She is not in acute distress.    Appearance: She is well-developed and well-groomed.  HENT:     Head: Normocephalic and atraumatic.  Eyes:     General: Vision grossly intact.     Conjunctiva/sclera: Conjunctivae normal.     Pupils: Pupils are equal, round, and reactive to light.  Cardiovascular:     Rate and Rhythm: Normal rate and regular rhythm.     Heart sounds: No murmur heard. Pulmonary:     Effort: Pulmonary effort is normal. No respiratory distress.     Breath sounds: Normal breath sounds.  Abdominal:     Palpations: Abdomen is soft.     Tenderness: There is no abdominal tenderness.  Musculoskeletal:        General: No swelling.     Cervical back: Normal range of motion and neck supple.  Skin:    General: Skin is warm and dry.     Capillary Refill: Capillary refill takes less than 2 seconds.  Neurological:     General: No focal deficit present.     Mental Status: She is alert and oriented to person, place, and time.     GCS: GCS eye subscore is 4. GCS verbal subscore is 5. GCS motor subscore is 6.  Psychiatric:        Attention and Perception: Attention normal.        Mood and Affect: Mood normal.        Speech: Speech normal.        Behavior: Behavior normal. Behavior is cooperative.  UC Treatments  / Results  Labs (all labs ordered are listed, but only abnormal results are displayed) Labs Reviewed  URINALYSIS, W/ REFLEX TO CULTURE (INFECTION SUSPECTED)    EKG   Radiology No results found.  Procedures Procedures (including critical care time)  Medications Ordered in UC Medications - No data to display  Initial Impression / Assessment and Plan / UC Course  I have reviewed the triage vital signs and the nursing notes.  Pertinent labs & imaging results that were available during my care of the patient were reviewed by me and considered in my medical decision making (see chart for details).    Discussed exam findings and plan of care with patient, will prescription Cipro as patient recently took Macrobid, urine culture pending.  Patient verbalized understanding this provider.  Ddx: Recurrent UTI, urinary reflux, dysuria, pyelonephritis Final Clinical Impressions(s) / UC Diagnoses   Final diagnoses:  None   Discharge Instructions   None    ED Prescriptions   None    PDMP not reviewed this encounter.   Clancy Gourd, NP 04/02/23 [phone]

## 2023-08-06 NOTE — Therapy (Unsigned)
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  ONSET DATE: ***  FOLLOW-UP APPT SCHEDULED WITH REFERRING PROVIDER: {yes/no:20286}    SUBJECTIVE:                                                                                                                                                                                         SUBJECTIVE STATEMENT:  ***  PERTINENT HISTORY: ***  PAIN:  Pain Intensity: Present: /10, Best: /10, Worst: /10 Pain location: *** Pain Quality: {PAIN DESCRIPTION:21022940}  Radiating: {yes/no:20286}  Numbness/Tingling: {yes/no:20286} Focal Weakness: {yes/no:20286} Aggravating factors: *** Relieving factors: *** 24-hour pain behavior: *** History of prior shoulder or neck/shoulder injury, pain, surgery, or therapy: {yes/no:20286} Falls: Has patient fallen in last 6 months? {yes/no:20286}, Number of falls: *** Dominant hand: {RIGHT/LEFT:20294} Imaging: {yes/no:20286}  Red flags (personal history of cancer, chills/fever, night sweats, nausea, vomiting, unrelenting pain, unexplained weight gain/loss): Negative  PRECAUTIONS: {Therapy precautions:24002}  WEIGHT BEARING RESTRICTIONS: {Yes ***/No:24003}  FALLS: Has patient fallen in last 6 months? {fallsyesno:27318}  Living Environment Lives with:  {OP[facility] lives with:25569::"lives with their family"} Lives in: {Lives in:25570} Stairs: {opstairs:27293} Has following equipment at home: {Assistive devices:23999}  Prior level of function: {PLOF:24004}  Occupational demands:   Hobbies:   Patient Goals: ***    OBJECTIVE:   Patient Surveys  FOTO: QuickDASH:  Cognition Patient is oriented to person, place, and time.  Recent memory is intact.  Remote memory is intact.  Attention span and concentration are intact.  Expressive speech is intact.  Patient's fund of knowledge is within normal limits for educational level.    Gross Musculoskeletal Assessment Tremor: None Bulk: Normal Tone: Normal  Gait   Posture   Cervical Screen AROM: WFL and painless with overpressure in all planes Spurlings A (ipsilateral lateral flexion/axial compression): R: {NEGATIVE/POSITIVE FUX:32355} L: {NEGATIVE/POSITIVE DDU:20254} Spurlings B (ipsilateral lateral flexion/contralateral rotation/axial compression): R: {NEGATIVE/POSITIVE YHC:62376} L: {NEGATIVE/POSITIVE EGB:15176} Repeated movement: No centralization or peripheralization with protraction or retraction Hoffman Sign (cervical cord  compression): R: {NEGATIVE/POSITIVE WUJ:81191} L: {NEGATIVE/POSITIVE YNW:29562} ULTT Median: R: {NEGATIVE/POSITIVE ZHY:86578} L: {NEGATIVE/POSITIVE ION:62952} ULTT Ulnar: R: {NEGATIVE/POSITIVE WUX:32440} L: {NEGATIVE/POSITIVE NUU:72536} ULTT Radial: R: {NEGATIVE/POSITIVE UYQ:03474} L: {NEGATIVE/POSITIVE QVZ:56387}  AROM AROM (Normal range in degrees) AROM  Cervical  Flexion (50)   Extension (80)   Right lateral flexion (45)   Left lateral flexion (45)   Right rotation (85)   Left rotation (85)    Right Left  Shoulder    Flexion    Extension    Abduction    External Rotation    Internal Rotation    Hands Behind Head    Hands Behind Back        Elbow    Flexion    Extension    Pronation    Supination    (* = pain; Blank rows = not  tested)  UE MMT: MMT (out of 5) Right Left   Cervical (isometric)  Flexion WNL  Extension WNL  Lateral Flexion WNL WNL  Rotation WNL WNL      Shoulder   Flexion    Extension    Abduction    External rotation    Internal rotation    Horizontal abduction    Horizontal adduction    Lower Trapezius    Rhomboids        Elbow  Flexion    Extension    Pronation    Supination        Wrist  Flexion    Extension    Radial deviation    Ulnar deviation        MCP  Flexion    Extension    Abduction    Adduction    (* = pain; Blank rows = not tested)  Sensation Grossly intact to light touch bilateral UE as determined by testing dermatomes C2-T2. Proprioception and hot/cold testing deferred on this date.  Reflexes R/L Biceps (L3/4): 2+/2+  Triceps (S1/2): 2+/2+  Brachioradialis: 2+/2  Palpation Location LEFT  RIGHT           Subocciptials    Cervical paraspinals    Upper Trapezius    Levator Scapulae    Rhomboid Major/Minor    Sternoclavicular joint    Acromioclavicular joint    Coracoid process    Long head of biceps    Supraspinatus    Infraspinatus    Subscapularis    Teres Minor    Teres Major    Pectoralis Major    Pectoralis Minor    Anterior Deltoid    Lateral Deltoid    Posterior Deltoid    Latissimus Dorsi    Sternocleidomastoid    (Blank rows = not tested) Graded on 0-4 scale (0 = no pain, 1 = pain, 2 = pain with wincing/grimacing/flinching, 3 = pain with withdrawal, 4 = unwilling to allow palpation), (Blank rows = not tested)   Passive Accessory Intervertebral Motion Pt denies reproduction of shoulder pain with CPA C2-T7 and UPA bilaterally C2-T7. Generally, hypomobile throughout  Accessory Motions/Glides Glenohumeral: Posterior: R: {normal/abnormal/not examined:14677} L: {normal/abnormal/not examined:14677} Inferior: R: {normal/abnormal/not examined:14677} L: {normal/abnormal/not examined:14677} Anterior: R: {normal/abnormal/not  examined:14677} L: {normal/abnormal/not examined:14677}  Acromioclavicular:  Posterior: R: {normal/abnormal/not examined:14677} L: {normal/abnormal/not examined:14677} Anterior: R: {normal/abnormal/not examined:14677} L: {normal/abnormal/not examined:14677}  Sternoclavicular: Posterior: R: {normal/abnormal/not examined:14677} L: {normal/abnormal/not examined:14677} Anterior: R: {normal/abnormal/not examined:14677} L: {normal/abnormal/not examined:14677} Superior: R: {normal/abnormal/not examined:14677} L: {normal/abnormal/not examined:14677} Inferior: R: {normal/abnormal/not examined:14677} L: {normal/abnormal/not examined:14677}  Scapulothoracic: Distraction: R: {normal/abnormal/not examined:14677} L: {normal/abnormal/not examined:14677} Medial: R: {normal/abnormal/not examined:14677} L: {  normal/abnormal/not examined:14677} Lateral: R: {normal/abnormal/not examined:14677} L: {normal/abnormal/not examined:14677} Inferior: R: {normal/abnormal/not examined:14677} L: {normal/abnormal/not examined:14677} Superior: R: {normal/abnormal/not examined:14677} L: {normal/abnormal/not examined:14677} Upward Rotation: R: {normal/abnormal/not examined:14677} L: {normal/abnormal/not examined:14677} Downward Rotation: R: {normal/abnormal/not examined:14677} L: {normal/abnormal/not examined:14677}  Muscle Length Testing Pectoralis Major: R: {normal/abnormal/not examined:14677} L: {normal/abnormal/not examined:14677} Pectoralis Minor: R: {normal/abnormal/not examined:14677} L: {normal/abnormal/not examined:14677} Biceps: R: {normal/abnormal/not examined:14677} L: {normal/abnormal/not examined:14677}  SPECIAL TESTS Rotator Cuff  Drop Arm Test: {NEGATIVE/POSITIVE ZOX:09604} Painful Arc (Pain from 60 to 120 degrees scaption): {NEGATIVE/POSITIVE VWU:98119} Infraspinatus Muscle Test: {NEGATIVE/POSITIVE JYN:82956} If all 3 tests positive, the probability of a full-thickness rotator cuff tear is  91%  Subacromial Impingement Hawkins-Kennedy: {NEGATIVE/POSITIVE OZH:08657} Neer (Block scapula, PROM flexion): {NEGATIVE/POSITIVE QIO:96295} Painful Arc (Pain from 60 to 120 degrees scaption): {NEGATIVE/POSITIVE MWU:13244} Empty Can: {NEGATIVE/POSITIVE WNU:27253} External Rotation Resistance: {NEGATIVE/POSITIVE GUY:40347} Horizontal Adduction: {NEGATIVE/POSITIVE QQV:95638} Scapular Assist: {NEGATIVE/POSITIVE VFI:43329} Positive Hawkins-Kennedy, Painful arc sign, Infraspinatus muscle test then +LR: 10.56 of some type of impingement present, 2/3 tests: +LR 5.06, -LR 0.17, Positive 3/5 Hawkins-Kennedy, neer, painful arc, empty can, and external rotation resistance then SN: .75 (.54-.96) SP: .74 (.61-.88) +LR: 2.93 (1.60-5.36) -LR: .34 (.14-.80)  Labral Tear Biceps Load II (120 elevation, full ER, 90 elbow flexion, full supination, resisted elbow flexion): {NEGATIVE/POSITIVE JJO:84166} Crank (160 scaption, axial load with IR/ER): {NEGATIVE/POSITIVE AYT:01601} O'Briens/Active Compression Test (90 shoulder flexion, 10 adduction, full IR): {NEGATIVE/POSITIVE UXN:23557}  Bicep Tendon Pathology Speed (shoulder flexion to 90, external rotation, full elbow extension, and forearm supination with resistance: {NEGATIVE/POSITIVE DUK:02542} Yergason's (resisted shoulder ER and supination/biceps tendon pathology): {NEGATIVE/POSITIVE HCW:23762}  Shoulder Instability Sulcus Sign: {NEGATIVE/POSITIVE GBT:51761} Anterior Apprehension: {NEGATIVE/POSITIVE YWV:37106}  Beighton scale  LEFT  RIGHT           1. Passive dorsiflexion and hyperextension of the fifth MCP joint beyond 90  0 0   2. Passive apposition of the thumb to the flexor aspect of the forearm  0  0   3. Passive hyperextension of the elbow beyond 10  0  0   4. Passive hyperextension of the knee beyond 10  0  0   5. Active forward flexion of the trunk with the knees fully extended so that the palms of the hands rest flat on the floor   0    TOTAL         0/ 9      TODAY'S TREATMENT  ***   PATIENT EDUCATION:  Education details: *** Person educated: {Person educated:25204} Education method: {Education Method:25205} Education comprehension: {Education Comprehension:25206}   HOME EXERCISE PROGRAM:    ASSESSMENT:  CLINICAL IMPRESSION: Patient is a *** y.o. *** who was seen today for physical therapy evaluation and treatment for ***.   OBJECTIVE IMPAIRMENTS: {opptimpairments:25111}.   ACTIVITY LIMITATIONS: {activitylimitations:27494}  PARTICIPATION LIMITATIONS: {participationrestrictions:25113}  PERSONAL FACTORS: {Personal factors:25162} are also affecting patient's functional outcome.   REHAB POTENTIAL: {rehabpotential:25112}  CLINICAL DECISION MAKING: {clinical decision making:25114}  EVALUATION COMPLEXITY: {Evaluation complexity:25115}   GOALS: Goals reviewed with patient? Yes  SHORT TERM GOALS: Target date: {follow up:25551}  Pt will be independent with HEP to improve strength and decrease shoulder pain to improve pain-free function at home and work. Baseline: *** Goal status: INITIAL   LONG TERM GOALS: Target date: {follow up:25551}  Pt will increase FOTO to at least *** to demonstrate significant improvement in function at home and work related to shoulder pain  Baseline:  Goal status: INITIAL  2.  Pt will decrease worst shoulder pain by at least 3 points on  the NPRS in order to demonstrate clinically significant reduction in shoulder pain. Baseline: *** Goal status: INITIAL  3.  Pt will decrease quick DASH score by at least 8% in order to demonstrate clinically significant reduction in disability related to shoulder pain        Baseline: *** Goal status: INITIAL  4. Pt will increase strength by at least 1/2 MMT grade in order to demonstrate improvement in strength and function         Baseline: *** Goal status: INITIAL   PLAN: PT FREQUENCY: 1-2x/week  PT DURATION: {rehab  duration:25117}  PLANNED INTERVENTIONS: Therapeutic exercises, Therapeutic activity, Neuromuscular re-education, Balance training, Gait training, Patient/Family education, Self Care, Joint mobilization, Joint manipulation, Vestibular training, Canalith repositioning, Orthotic/Fit training, DME instructions, Dry Needling, Electrical stimulation, Spinal manipulation, Spinal mobilization, Cryotherapy, Moist heat, Taping, Traction, Ultrasound, Ionotophoresis 4mg /ml Dexamethasone, Manual therapy, and Re-evaluation.  PLAN FOR NEXT SESSION: ***   Sharalyn Ink Stephaine Breshears PT, DPT, GCS  Inioluwa Baris, PT 08/06/2023, 12:19 PM

## 2023-08-08 ENCOUNTER — Ambulatory Visit: Payer: No Typology Code available for payment source | Attending: Family Medicine

## 2023-08-08 DIAGNOSIS — M25512 Pain in left shoulder: Secondary | ICD-10-CM | POA: Insufficient documentation

## 2023-08-08 DIAGNOSIS — M25612 Stiffness of left shoulder, not elsewhere classified: Secondary | ICD-10-CM | POA: Diagnosis present

## 2023-08-08 DIAGNOSIS — G8929 Other chronic pain: Secondary | ICD-10-CM | POA: Insufficient documentation

## 2023-08-12 NOTE — Therapy (Signed)
OUTPATIENT PHYSICAL THERAPY SHOULDER TREATMENT  Patient Name: Heather Golden MRN: [id number] DOB:09-30-1965, 57 y.o., female Today's Date: 08/12/2023  END OF SESSION:   Past Medical History:  Diagnosis Date   Diabetes mellitus without complication (HCC)    controlled - no meds   Past Surgical History:  Procedure Laterality Date   BACK SURGERY     GASTROPLASTY DUODENAL SWITCH     Patient Active Problem List   Diagnosis Date Noted   Dysuria 04/02/2023   Recurrent UTI 04/02/2023   PCP: Rossie Muskrat MD  REFERRING PROVIDER: Tiana Loft FNP  REFERRING DIAG: M75.02 (ICD-10-CM) - Adhesive capsulitis of left shoulder   RATIONALE FOR EVALUATION AND TREATMENT: Rehabilitation  THERAPY DIAG: Chronic left shoulder pain  ONSET DATE: At least 6 months ago  FOLLOW-UP APPT SCHEDULED WITH REFERRING PROVIDER: Yes    SUBJECTIVE:                                                                                                                                                                                         SUBJECTIVE STATEMENT:  L shoulder ROM limitations and pain  PERTINENT HISTORY:  Pt reports progressive loss of L shoulder ROM over the period of at least 6 months. Her most limited motions are external rotation, abduction, and horizontal abduction. She was diagnosed with adhesive capsulitis and given her a steroid injection on 06/22/23. Since the steroid injection she has noticed an improvement in her range of motion and pain. She has a remote history of L shoulder limitations and pain when reaching overhead. PMH includes DM and bariatric surgery. She has lost a significant amount of weight recently since she started taking Wegovy.   PAIN:  Pain Intensity: Present: 0/10, Best: 0/10, Worst: 7/10 Pain location: Generalized L shoulder pain radiating into the L upper arm Pain Quality: Intermittent sharp pain, occasional throbbing pain  Radiating: Yes, into L upper  arm Numbness/Tingling: No Focal Weakness: No Aggravating factors: Holding steering wheel, sitting at her desk, horizontal abduction such as when trying to sleep. Relieving factors: steroid injection, avoiding aggravating positions 24-hour pain behavior: notices pain more at night due to aggravating positions, pain wakes her up if she rolls onto L side History of prior shoulder or neck/shoulder injury, pain, surgery, or therapy: No Falls: Has patient fallen in last 6 months? No, Dominant hand: left Imaging: Yes, plain films but results unavailable for review. Per pt no issues identified on radiographs Red flags (personal history of cancer, chills/fever, night sweats, nausea, vomiting, unrelenting pain, unexplained weight gain/loss): Negative  PRECAUTIONS: None  WEIGHT BEARING RESTRICTIONS: No  FALLS: Has patient fallen in last 6 months? No  Living Environment Lives with: lives alone  Prior level of function: Independent  Occupational demands: Psychologist, educational  Hobbies: travel, reading, streaming shows/movies, live music  Patient Goals: Sleep on L side, put luggage in overhead bin, reach into cabinets    OBJECTIVE:   Patient Surveys  FOTO: 54, predicted improvement to 24 QuickDASH: To be completed  Cognition Patient is oriented to person, place, and time.  Recent memory is intact.  Remote memory is intact.  Attention span and concentration are intact.  Expressive speech is intact.  Patient's fund of knowledge is within normal limits for educational level.    Gross Musculoskeletal Assessment Tremor: None Bulk: Normal Tone: Not tested  Gait Deferred  Posture No gross deficits identified contributing to symptoms  Cervical Screen AROM: WFL and painless with overpressure in all planes. No reproduction of shoulder pain. Pt reports some L upper trap tightness with L cervical rotation. Spurlings A (ipsilateral lateral flexion/axial compression): R: Negative L:  Negative Spurlings B (ipsilateral lateral flexion/contralateral rotation/axial compression): R: Negative L: Negative Repeated movement: Deferred Hoffman Sign (cervical cord compression): R: Not examined L: Not examined ULTT Median: R: Not examined L: Not examined ULTT Ulnar: R: Not examined L: Not examined ULTT Radial: R: Not examined L: Not examined  AROM AROM (Normal range in degrees) AROM  Cervical  Flexion (50) WNL  Extension (80) Slight limitation, no pain  Right lateral flexion (45) WNL  Left lateral flexion (45) WNL  Right rotation (85) WNL  Left rotation (85) WNL* (L upper trap pain)   Right Left  Shoulder    Flexion 173 155*  Extension WNL WNL  Abduction 166* (pain with OP) 92*  External Rotation 90 47*  Internal Rotation 68 35*  Hands Behind Head T5 T1*  Hands Behind Back T7 Waistline*      Elbow    Flexion WNL WNL  Extension WNL WNL  Pronation    Supination    (* = pain; Blank rows = not tested)  UE MMT: MMT (out of 5) Right Left   Shoulder   Flexion 5 5  Extension 4 4  Abduction 5 5*  External rotation 5 5  Internal rotation 5 5*  Horizontal abduction 4 3+*  Horizontal adduction    Lower Trapezius 4 Unable to test  Rhomboids 4 4*      Elbow  Flexion 5 5  Extension 5 5  Pronation    Supination        Wrist  Flexion 5 5  Extension 5 5  Radial deviation    Ulnar deviation        MCP  Flexion 5 5  Extension 5 5  Abduction    Adduction    (* = pain; Blank rows = not tested)  Sensation Grossly intact to light touch bilateral UE as determined by testing dermatomes C2-T2. Proprioception and hot/cold testing deferred on this date.  Reflexes Deferred  Palpation Location LEFT  RIGHT           Subocciptials 0   Cervical paraspinals 0   Upper Trapezius 0   Levator Scapulae 0   Rhomboid Major/Minor    Sternoclavicular joint 0   Acromioclavicular joint 0   Coracoid process 0   Long head of biceps 1   Supraspinatus 0   Infraspinatus 1    Subscapularis    Teres Minor    Teres Major    Pectoralis Major 0   Pectoralis Minor    Anterior Deltoid 0  Lateral Deltoid 0   Posterior Deltoid 1   Latissimus Dorsi    Sternocleidomastoid    (Blank rows = not tested) Graded on 0-4 scale (0 = no pain, 1 = pain, 2 = pain with wincing/grimacing/flinching, 3 = pain with withdrawal, 4 = unwilling to allow palpation), (Blank rows = not tested)   Passive Accessory Intervertebral Motion Pt denies reproduction of shoulder pain with CPA C2-T7 and UPA bilaterally C2-T7. Generally, hypomobile throughout  Accessory Motions/Glides Glenohumeral: Posterior: R: not examined L: abnormal severely restricted with pain Inferior: R: not examined L: abnormal, hypomobile but no pain Anterior: R: not examined L: normal  Acromioclavicular:  Posterior: R: not examined L: normal Anterior: R: not examined L: normal  Sternoclavicular: Posterior: R: not examined L: normal Anterior: R: not examined L: normal Superior: R: not examined L: normal Inferior: R: not examined L: normal  Scapulothoracic: Distraction: R: not examined L: not examined Medial: R: not examined L: not examined Lateral: R: not examined L: not examined Inferior: R: not examined L: not examined Superior: R: not examined L: not examined Upward Rotation: R: not examined L: not examined Downward Rotation: R: not examined L: not examined  Muscle Length Testing Pectoralis Major: R: not examined L: normal Pectoralis Minor: R: not examined L: not examined Biceps: R: not examined L: not examined  SPECIAL TESTS Rotator Cuff  Drop Arm Test: Negative Painful Arc (Pain from 60 to 120 degrees scaption): Negative Infraspinatus Muscle Test: Positive for pain  Subacromial Impingement Hawkins-Kennedy: Not examined Neer (Block scapula, PROM flexion): Positive Painful Arc (Pain from 60 to 120 degrees scaption): Negative Empty Can: Positive External Rotation Resistance:  Positive Horizontal Adduction: Not examined Scapular Assist: Not examined   Labral Tear Biceps Load II (120 elevation, full ER, 90 elbow flexion, full supination, resisted elbow flexion): Not examined Crank (160 scaption, axial load with IR/ER): Not examined O'Briens/Active Compression Test (90 shoulder flexion, 10 adduction, full IR): Not examined  Bicep Tendon Pathology Speed (shoulder flexion to 90, external rotation, full elbow extension, and forearm supination with resistance: Not examined Yergason's (resisted shoulder ER and supination/biceps tendon pathology): Not examined  Shoulder Instability Sulcus Sign: Not examined Anterior Apprehension: Not examined  Beighton scale Deferred, no history of hyperflexibility   TODAY'S TREATMENT: 08/12/23 *** Subjective:   Quick DASH:     PATIENT EDUCATION:  Education details: Plan of care Person educated: Patient Education method: Explanation Education comprehension: verbalized understanding   HOME EXERCISE PROGRAM:  None currently   ASSESSMENT:  CLINICAL IMPRESSION: ***  Patient is a 57 y.o. female who was seen today for physical therapy evaluation and treatment for L shoulder pain and range of motion limitations.   OBJECTIVE IMPAIRMENTS: decreased ROM, decreased strength, and pain.   ACTIVITY LIMITATIONS: carrying, lifting, dressing, and reach over head  PARTICIPATION LIMITATIONS: meal prep, cleaning, shopping, community activity, and occupation  PERSONAL FACTORS: Past/current experiences, Time since onset of injury/illness/exacerbation, and 1 comorbidity: DM  are also affecting patient's functional outcome.   REHAB POTENTIAL: Excellent  CLINICAL DECISION MAKING: Stable/uncomplicated  EVALUATION COMPLEXITY: Low   GOALS: Goals reviewed with patient? Yes  SHORT TERM GOALS: Target date: 09/05/2023  Pt will be independent with HEP to improve strength and decrease shoulder pain to improve pain-free function at  home and work. Baseline:  Goal status: INITIAL   LONG TERM GOALS: Target date: 10/03/2023  Pt will increase FOTO to at least 67 to demonstrate significant improvement in function at home and work related to shoulder pain  Baseline: 54 Goal status: INITIAL  2.  Pt will decrease worst shoulder pain by at least 3 points on the NPRS in order to demonstrate clinically significant reduction in shoulder pain. Baseline: 7/10; Goal status: INITIAL  3.  Pt will decrease quick DASH score by at least 8% in order to demonstrate clinically significant reduction in disability related to shoulder pain        Baseline: To be completed Goal status: INITIAL  4. Pt will increase L shoulder pain-free AROM abduction and external rotation range of motion to within 10 degrees of R shoulder in order to demonstrate improvement in functional range of motion.         Baseline: abd/ER: 92/47 both with pain Goal status: INITIAL   PLAN: PT FREQUENCY: 1-2x/week  PT DURATION: 8 weeks  PLANNED INTERVENTIONS: Therapeutic exercises, Therapeutic activity, Neuromuscular re-education, Balance training, Gait training, Patient/Family education, Self Care, Joint mobilization, Joint manipulation, Vestibular training, Canalith repositioning, Orthotic/Fit training, DME instructions, Dry Needling, Electrical stimulation, Spinal manipulation, Spinal mobilization, Cryotherapy, Moist heat, Taping, Traction, Ultrasound, Ionotophoresis 4mg /ml Dexamethasone, Manual therapy, and Re-evaluation.  PLAN FOR NEXT SESSION:  Pt to complete QuickDASH, initiate range of motion and manual therapy, consider TDN, progressive strengthening when appropriate, issue HEP  Maylon Peppers, PT, DPT 08/12/2023, 1:13 PM

## 2023-08-13 ENCOUNTER — Ambulatory Visit: Payer: No Typology Code available for payment source

## 2023-08-13 DIAGNOSIS — G8929 Other chronic pain: Secondary | ICD-10-CM

## 2023-08-13 DIAGNOSIS — M25512 Pain in left shoulder: Secondary | ICD-10-CM | POA: Diagnosis not present

## 2023-08-15 ENCOUNTER — Ambulatory Visit: Payer: No Typology Code available for payment source

## 2023-08-15 DIAGNOSIS — M25512 Pain in left shoulder: Secondary | ICD-10-CM | POA: Diagnosis not present

## 2023-08-15 DIAGNOSIS — G8929 Other chronic pain: Secondary | ICD-10-CM

## 2023-08-15 NOTE — Therapy (Signed)
OUTPATIENT PHYSICAL THERAPY SHOULDER TREATMENT  Patient Name: Heather Golden MRN: [id number] DOB:Mar 21, 1966, 57 y.o., female Today's Date: 08/15/2023  END OF SESSION:  PT End of Session - 08/15/23 1537     Visit Number 3    Number of Visits 17    Date for PT Re-Evaluation 10/03/23    Authorization Type eval: 08/08/23    PT Start Time 1535    PT Stop Time 1620    PT Time Calculation (min) 45 min    Activity Tolerance Patient tolerated treatment well    Behavior During Therapy WFL for tasks assessed/performed            Past Medical History:  Diagnosis Date   Diabetes mellitus without complication (HCC)    controlled - no meds   Past Surgical History:  Procedure Laterality Date   BACK SURGERY     GASTROPLASTY DUODENAL SWITCH     Patient Active Problem List   Diagnosis Date Noted   Dysuria 04/02/2023   Recurrent UTI 04/02/2023   PCP: Rossie Muskrat MD  REFERRING PROVIDER: Tiana Loft FNP  REFERRING DIAG: M75.02 (ICD-10-CM) - Adhesive capsulitis of left shoulder   RATIONALE FOR EVALUATION AND TREATMENT: Rehabilitation  THERAPY DIAG: Chronic left shoulder pain  ONSET DATE: At least 6 months ago  FOLLOW-UP APPT SCHEDULED WITH REFERRING PROVIDER: Yes    FROM INITIAL EVALUATION SUBJECTIVE:                                                                                                                                                                                         SUBJECTIVE STATEMENT:  L shoulder ROM limitations and pain  PERTINENT HISTORY:  Pt reports progressive loss of L shoulder ROM over the period of at least 6 months. Her most limited motions are external rotation, abduction, and horizontal abduction. She was diagnosed with adhesive capsulitis and given her a steroid injection on 06/22/23. Since the steroid injection she has noticed an improvement in her range of motion and pain. She has a remote history of L shoulder limitations and pain when  reaching overhead. PMH includes DM and bariatric surgery. She has lost a significant amount of weight recently since she started taking Wegovy.   PAIN:  Pain Intensity: Present: 0/10, Best: 0/10, Worst: 7/10 Pain location: Generalized L shoulder pain radiating into the L upper arm Pain Quality: Intermittent sharp pain, occasional throbbing pain  Radiating: Yes, into L upper arm Numbness/Tingling: No Focal Weakness: No Aggravating factors: Holding steering wheel, sitting at her desk, horizontal abduction such as when trying to sleep. Relieving factors: steroid injection, avoiding aggravating positions 24-hour pain behavior: notices pain more at  night due to aggravating positions, pain wakes her up if she rolls onto L side History of prior shoulder or neck/shoulder injury, pain, surgery, or therapy: No Falls: Has patient fallen in last 6 months? No, Dominant hand: left Imaging: Yes, plain films but results unavailable for review. Per pt no issues identified on radiographs Red flags (personal history of cancer, chills/fever, night sweats, nausea, vomiting, unrelenting pain, unexplained weight gain/loss): Negative  PRECAUTIONS: None  WEIGHT BEARING RESTRICTIONS: No  FALLS: Has patient fallen in last 6 months? No  Living Environment Lives with: lives alone  Prior level of function: Independent  Occupational demands: Psychologist, educational  Hobbies: travel, reading, streaming shows/movies, live music  Patient Goals: Sleep on L side, put luggage in overhead bin, reach into cabinets   OBJECTIVE:   Patient Surveys  FOTO: 54, predicted improvement to 42 QuickDASH: To be completed  Cognition Patient is oriented to person, place, and time.  Recent memory is intact.  Remote memory is intact.  Attention span and concentration are intact.  Expressive speech is intact.  Patient's fund of knowledge is within normal limits for educational level.    Gross Musculoskeletal Assessment Tremor:  None Bulk: Normal Tone: Not tested  Gait Deferred  Posture No gross deficits identified contributing to symptoms  Cervical Screen AROM: WFL and painless with overpressure in all planes. No reproduction of shoulder pain. Pt reports some L upper trap tightness with L cervical rotation. Spurlings A (ipsilateral lateral flexion/axial compression): R: Negative L: Negative Spurlings B (ipsilateral lateral flexion/contralateral rotation/axial compression): R: Negative L: Negative Repeated movement: Deferred Hoffman Sign (cervical cord compression): R: Not examined L: Not examined ULTT Median: R: Not examined L: Not examined ULTT Ulnar: R: Not examined L: Not examined ULTT Radial: R: Not examined L: Not examined  AROM AROM (Normal range in degrees) AROM  Cervical  Flexion (50) WNL  Extension (80) Slight limitation, no pain  Right lateral flexion (45) WNL  Left lateral flexion (45) WNL  Right rotation (85) WNL  Left rotation (85) WNL* (L upper trap pain)   Right Left  Shoulder    Flexion 173 155*  Extension WNL WNL  Abduction 166* (pain with OP) 92*  External Rotation 90 47*  Internal Rotation 68 35*  Hands Behind Head T5 T1*  Hands Behind Back T7 Waistline*      Elbow    Flexion WNL WNL  Extension WNL WNL  Pronation    Supination    (* = pain; Blank rows = not tested)  UE MMT: MMT (out of 5) Right Left   Shoulder   Flexion 5 5  Extension 4 4  Abduction 5 5*  External rotation 5 5  Internal rotation 5 5*  Horizontal abduction 4 3+*  Horizontal adduction    Lower Trapezius 4 Unable to test  Rhomboids 4 4*      Elbow  Flexion 5 5  Extension 5 5  Pronation    Supination        Wrist  Flexion 5 5  Extension 5 5  Radial deviation    Ulnar deviation        MCP  Flexion 5 5  Extension 5 5  Abduction    Adduction    (* = pain; Blank rows = not tested)  Sensation Grossly intact to light touch bilateral UE as determined by testing dermatomes C2-T2.  Proprioception and hot/cold testing deferred on this date.  Reflexes Deferred  Palpation Location LEFT  RIGHT  Subocciptials 0   Cervical paraspinals 0   Upper Trapezius 0   Levator Scapulae 0   Rhomboid Major/Minor    Sternoclavicular joint 0   Acromioclavicular joint 0   Coracoid process 0   Long head of biceps 1   Supraspinatus 0   Infraspinatus 1   Subscapularis    Teres Minor    Teres Major    Pectoralis Major 0   Pectoralis Minor    Anterior Deltoid 0   Lateral Deltoid 0   Posterior Deltoid 1   Latissimus Dorsi    Sternocleidomastoid    (Blank rows = not tested) Graded on 0-4 scale (0 = no pain, 1 = pain, 2 = pain with wincing/grimacing/flinching, 3 = pain with withdrawal, 4 = unwilling to allow palpation), (Blank rows = not tested)  Passive Accessory Intervertebral Motion Pt denies reproduction of shoulder pain with CPA C2-T7 and UPA bilaterally C2-T7. Generally, hypomobile throughout  Accessory Motions/Glides Glenohumeral: Posterior: R: not examined L: abnormal severely restricted with pain Inferior: R: not examined L: abnormal, hypomobile but no pain Anterior: R: not examined L: normal  Acromioclavicular:  Posterior: R: not examined L: normal Anterior: R: not examined L: normal  Sternoclavicular: Posterior: R: not examined L: normal Anterior: R: not examined L: normal Superior: R: not examined L: normal Inferior: R: not examined L: normal  Scapulothoracic: Distraction: R: not examined L: not examined Medial: R: not examined L: not examined Lateral: R: not examined L: not examined Inferior: R: not examined L: not examined Superior: R: not examined L: not examined Upward Rotation: R: not examined L: not examined Downward Rotation: R: not examined L: not examined  Muscle Length Testing Pectoralis Major: R: not examined L: normal Pectoralis Minor: R: not examined L: not examined Biceps: R: not examined L: not examined  SPECIAL  TESTS Rotator Cuff  Drop Arm Test: Negative Painful Arc (Pain from 60 to 120 degrees scaption): Negative Infraspinatus Muscle Test: Positive for pain  Subacromial Impingement Hawkins-Kennedy: Not examined Neer (Block scapula, PROM flexion): Positive Painful Arc (Pain from 60 to 120 degrees scaption): Negative Empty Can: Positive External Rotation Resistance: Positive Horizontal Adduction: Not examined Scapular Assist: Not examined  Labral Tear Biceps Load II (120 elevation, full ER, 90 elbow flexion, full supination, resisted elbow flexion): Not examined Crank (160 scaption, axial load with IR/ER): Not examined O'Briens/Active Compression Test (90 shoulder flexion, 10 adduction, full IR): Not examined  Bicep Tendon Pathology Speed (shoulder flexion to 90, external rotation, full elbow extension, and forearm supination with resistance: Not examined Yergason's (resisted shoulder ER and supination/biceps tendon pathology): Not examined  Shoulder Instability Sulcus Sign: Not examined Anterior Apprehension: Not examined  Beighton scale Deferred, no history of hyperflexibility   TODAY'S TREATMENT:    SUBJECTIVE: Patient reports she is doing well today. Notices slight improvement in L shoulder abduction ROM as well as L shoulder pain since last therapy session. She has been able to lay on her L side at night. No resting pain upon arrival   PAIN: Denies   Ther-ex  UBE x 4 minute (2 min forward/2 min backwards) at start of session for warm-up AAROM, and strengthening during interval history.  Seated pulleys for L shoulder AAROM flexion and scaption x multiple bouts each with end range hold; Seated forward and L lateral table slides to improve L shoulder flexion and abduction respectively; Supine L shoulder flexion from neutral to 90 with 2# dumbbell x 10; Supine L shoulder serratus punch at 90 flexion with 2#  dumbbell x 10; Supine L shoulder rhythmic stabilization at 90 flexion  x 30s; HEP updated and reviewed with patient;   Manual Therapy   PROM L shoulder abduction, flexion, and ER x 15 minutes  L GH AP mobilizations at neutral, grade II, 30s/bout x 3 bouts; L GH AP mobilizations at 90 abduction and available end range ER, grade II, 30s/bout x 3 bouts; Supine STM to L upper trap, anterior, and lateral deltoid; Cold pack applied to L shoulder (unbilled);   PATIENT EDUCATION:  Education details: Plan of care and updated HEP; Person educated: Patient Education method: Explanation Education comprehension: verbalized understanding   HOME EXERCISE PROGRAM:  Access Code: [email or web address] URL: [email or web address] Date: 08/15/2023 Prepared by: Ria Comment  Exercises - Standing Isometric Shoulder Internal Rotation at Doorway  - 1-2 x daily - 3-5 x weekly - 2 sets - 10 reps - Standing Isometric Shoulder External Rotation with Doorway  - 1-2 x daily - 3-5 x weekly - 2 sets - 10 reps - Standing Isometric Shoulder Flexion with Doorway - Arm Bent  - 1-2 x daily - 3-5 x weekly - 2 sets - 10 reps - Standing Isometric Shoulder Abduction with Doorway - Arm Bent  - 1-2 x daily - 3-5 x weekly - 2 sets - 10 reps - Supine Shoulder Flexion with Dowel  - 5-7 x weekly - 1-2 sets - 10 reps - Supine Shoulder Abduction AAROM with Dowel  - 5-7 x weekly - 1-2 sets - 10 reps - Supine Shoulder External Rotation in 45 Degrees Abduction AAROM with Dowel  - 5-7 x weekly - 1-2 sets - 10 reps - Seated Shoulder Flexion AAROM with Pulley Behind  - 5-7 x weekly - 1-2 sets - 10 reps - Seated Shoulder Abduction AAROM with Pulley Behind  - 7 x weekly - 1-2 sets - 10 reps - Seated Shoulder Flexion Slide at Table Top with Forearm in Neutral  - 7 x weekly - 1-2 sets - 10 reps - Seated Shoulder Abduction Towel Slide at Table Top  - 7 x weekly - 1-2 sets - 10 reps   ASSESSMENT:  CLINICAL IMPRESSION:   Patient arrives to treatment session motivated to participate with no complaints of  pain at rest. Session focused on manual interventions, AAROM, and progressive strengthening. HEP updated during session today with instruction provided. Tolerated treatment session well with improvement in AROM at end of session. Notable improvement in L shoulder abduction today compared to initial evaluation. Patient will continue to benefit from skilled therapy to address remaining deficits in order to improve quality of life and return to prior level of function.   OBJECTIVE IMPAIRMENTS: decreased ROM, decreased strength, and pain.   ACTIVITY LIMITATIONS: carrying, lifting, dressing, and reach over head  PARTICIPATION LIMITATIONS: meal prep, cleaning, shopping, community activity, and occupation  PERSONAL FACTORS: Past/current experiences, Time since onset of injury/illness/exacerbation, and 1 comorbidity: DM  are also affecting patient's functional outcome.   REHAB POTENTIAL: Excellent  CLINICAL DECISION MAKING: Stable/uncomplicated  EVALUATION COMPLEXITY: Low   GOALS: Goals reviewed with patient? Yes  SHORT TERM GOALS: Target date: 09/05/2023  Pt will be independent with HEP to improve strength and decrease shoulder pain to improve pain-free function at home and work. Baseline:  Goal status: INITIAL   LONG TERM GOALS: Target date: 10/03/2023  Pt will increase FOTO to at least 67 to demonstrate significant improvement in function at home and work related to shoulder pain  Baseline: 54 Goal status: INITIAL  2.  Pt will decrease worst shoulder pain by at least 3 points on the NPRS in order to demonstrate clinically significant reduction in shoulder pain. Baseline: 7/10; Goal status: INITIAL  3.  Pt will decrease quick DASH score by at least 8% in order to demonstrate clinically significant reduction in disability related to shoulder pain        Baseline: 20.5% Goal status: INITIAL  4. Pt will increase L shoulder pain-free AROM abduction and external rotation range of motion to  within 10 degrees of R shoulder in order to demonstrate improvement in functional range of motion.         Baseline: abd/ER: 92/47 both with pain Goal status: INITIAL   PLAN: PT FREQUENCY: 1-2x/week  PT DURATION: 8 weeks  PLANNED INTERVENTIONS: Therapeutic exercises, Therapeutic activity, Neuromuscular re-education, Balance training, Gait training, Patient/Family education, Self Care, Joint mobilization, Joint manipulation, Vestibular training, Canalith repositioning, Orthotic/Fit training, DME instructions, Dry Needling, Electrical stimulation, Spinal manipulation, Spinal mobilization, Cryotherapy, Moist heat, Taping, Traction, Ultrasound, Ionotophoresis 4mg /ml Dexamethasone, Manual therapy, and Re-evaluation.  PLAN FOR NEXT SESSION:  range of motion and manual therapy, consider TDN, progressive strengthening when appropriate  Sharalyn Ink Katti Pelle PT, DPT, GCS  08/15/2023, 4:53 PM

## 2023-08-16 NOTE — Therapy (Signed)
OUTPATIENT PHYSICAL THERAPY SHOULDER TREATMENT  Patient Name: Heather Golden MRN: [id number] DOB:[birth year], 57 y.o., female Today's Date: 08/20/2023  END OF SESSION:  PT End of Session - 08/20/23 0809     Visit Number 4    Number of Visits 17    Date for PT Re-Evaluation 10/03/23    Authorization Type eval: 08/08/23    PT Start Time 0807    PT Stop Time 0857    PT Time Calculation (min) 50 min    Activity Tolerance Patient tolerated treatment well    Behavior During Therapy [facility] for tasks assessed/performed            Past Medical History:  Diagnosis Date   Diabetes mellitus without complication (HCC)    controlled - no meds   Past Surgical History:  Procedure Laterality Date   BACK SURGERY     GASTROPLASTY DUODENAL SWITCH     Patient Active Problem List   Diagnosis Date Noted   Dysuria 04/02/2023   Recurrent UTI 04/02/2023   PCP: Rossie Muskrat MD  REFERRING PROVIDER: Tiana Loft FNP  REFERRING DIAG: M75.02 (ICD-10-CM) - Adhesive capsulitis of left shoulder   RATIONALE FOR EVALUATION AND TREATMENT: Rehabilitation  THERAPY DIAG: Chronic left shoulder pain  ONSET DATE: At least 6 months ago  FOLLOW-UP APPT SCHEDULED WITH REFERRING PROVIDER: Yes    FROM INITIAL EVALUATION SUBJECTIVE:                                                                                                                                                                                         SUBJECTIVE STATEMENT:  L shoulder ROM limitations and pain  PERTINENT HISTORY:  Pt reports progressive loss of L shoulder ROM over the period of at least 6 months. Her most limited motions are external rotation, abduction, and horizontal abduction. She was diagnosed with adhesive capsulitis and given her a steroid injection on 06/22/23. Since the steroid injection she has noticed an improvement in her range of motion and pain. She has a remote history of L shoulder limitations and pain when  reaching overhead. PMH includes DM and bariatric surgery. She has lost a significant amount of weight recently since she started taking Wegovy.   PAIN:  Pain Intensity: Present: 0/10, Best: 0/10, Worst: 7/10 Pain location: Generalized L shoulder pain radiating into the L upper arm Pain Quality: Intermittent sharp pain, occasional throbbing pain  Radiating: Yes, into L upper arm Numbness/Tingling: No Focal Weakness: No Aggravating factors: Holding steering wheel, sitting at her desk, horizontal abduction such as when trying to sleep. Relieving factors: steroid injection, avoiding aggravating positions 24-hour pain behavior: notices pain more at  night due to aggravating positions, pain wakes her up if she rolls onto L side History of prior shoulder or neck/shoulder injury, pain, surgery, or therapy: No Falls: Has patient fallen in last 6 months? No, Dominant hand: left Imaging: Yes, plain films but results unavailable for review. Per pt no issues identified on radiographs Red flags (personal history of cancer, chills/fever, night sweats, nausea, vomiting, unrelenting pain, unexplained weight gain/loss): Negative  PRECAUTIONS: None  WEIGHT BEARING RESTRICTIONS: No  FALLS: Has patient fallen in last 6 months? No  Living Environment Lives with: lives alone  Prior level of function: Independent  Occupational demands: Psychologist, educational  Hobbies: travel, reading, streaming shows/movies, live music  Patient Goals: Sleep on L side, put luggage in overhead bin, reach into cabinets   OBJECTIVE:   Patient Surveys  FOTO: 54, predicted improvement to 66 QuickDASH: To be completed  Cognition Patient is oriented to person, place, and time.  Recent memory is intact.  Remote memory is intact.  Attention span and concentration are intact.  Expressive speech is intact.  Patient's fund of knowledge is within normal limits for educational level.    Gross Musculoskeletal Assessment Tremor:  None Bulk: Normal Tone: Not tested  Gait Deferred  Posture No gross deficits identified contributing to symptoms  Cervical Screen AROM: WFL and painless with overpressure in all planes. No reproduction of shoulder pain. Pt reports some L upper trap tightness with L cervical rotation. Spurlings A (ipsilateral lateral flexion/axial compression): R: Negative L: Negative Spurlings B (ipsilateral lateral flexion/contralateral rotation/axial compression): R: Negative L: Negative Repeated movement: Deferred Hoffman Sign (cervical cord compression): R: Not examined L: Not examined ULTT Median: R: Not examined L: Not examined ULTT Ulnar: R: Not examined L: Not examined ULTT Radial: R: Not examined L: Not examined  AROM AROM (Normal range in degrees) AROM  Cervical  Flexion (50) WNL  Extension (80) Slight limitation, no pain  Right lateral flexion (45) WNL  Left lateral flexion (45) WNL  Right rotation (85) WNL  Left rotation (85) WNL* (L upper trap pain)   Right Left  Shoulder    Flexion 173 155*  Extension WNL WNL  Abduction 166* (pain with OP) 92*  External Rotation 90 47*  Internal Rotation 68 35*  Hands Behind Head T5 T1*  Hands Behind Back T7 Waistline*      Elbow    Flexion WNL WNL  Extension WNL WNL  Pronation    Supination    (* = pain; Blank rows = not tested)  UE MMT: MMT (out of 5) Right Left   Shoulder   Flexion 5 5  Extension 4 4  Abduction 5 5*  External rotation 5 5  Internal rotation 5 5*  Horizontal abduction 4 3+*  Horizontal adduction    Lower Trapezius 4 Unable to test  Rhomboids 4 4*      Elbow  Flexion 5 5  Extension 5 5  Pronation    Supination        Wrist  Flexion 5 5  Extension 5 5  Radial deviation    Ulnar deviation        MCP  Flexion 5 5  Extension 5 5  Abduction    Adduction    (* = pain; Blank rows = not tested)  Sensation Grossly intact to light touch bilateral UE as determined by testing dermatomes C2-T2.  Proprioception and hot/cold testing deferred on this date.  Reflexes Deferred  Palpation Location LEFT  RIGHT  Subocciptials 0   Cervical paraspinals 0   Upper Trapezius 0   Levator Scapulae 0   Rhomboid Major/Minor    Sternoclavicular joint 0   Acromioclavicular joint 0   Coracoid process 0   Long head of biceps 1   Supraspinatus 0   Infraspinatus 1   Subscapularis    Teres Minor    Teres Major    Pectoralis Major 0   Pectoralis Minor    Anterior Deltoid 0   Lateral Deltoid 0   Posterior Deltoid 1   Latissimus Dorsi    Sternocleidomastoid    (Blank rows = not tested) Graded on 0-4 scale (0 = no pain, 1 = pain, 2 = pain with wincing/grimacing/flinching, 3 = pain with withdrawal, 4 = unwilling to allow palpation), (Blank rows = not tested)  Passive Accessory Intervertebral Motion Pt denies reproduction of shoulder pain with CPA C2-T7 and UPA bilaterally C2-T7. Generally, hypomobile throughout  Accessory Motions/Glides Glenohumeral: Posterior: R: not examined L: abnormal severely restricted with pain Inferior: R: not examined L: abnormal, hypomobile but no pain Anterior: R: not examined L: normal  Acromioclavicular:  Posterior: R: not examined L: normal Anterior: R: not examined L: normal  Sternoclavicular: Posterior: R: not examined L: normal Anterior: R: not examined L: normal Superior: R: not examined L: normal Inferior: R: not examined L: normal  Scapulothoracic: Distraction: R: not examined L: not examined Medial: R: not examined L: not examined Lateral: R: not examined L: not examined Inferior: R: not examined L: not examined Superior: R: not examined L: not examined Upward Rotation: R: not examined L: not examined Downward Rotation: R: not examined L: not examined  Muscle Length Testing Pectoralis Major: R: not examined L: normal Pectoralis Minor: R: not examined L: not examined Biceps: R: not examined L: not examined  SPECIAL  TESTS Rotator Cuff  Drop Arm Test: Negative Painful Arc (Pain from 60 to 120 degrees scaption): Negative Infraspinatus Muscle Test: Positive for pain  Subacromial Impingement Hawkins-Kennedy: Not examined Neer (Block scapula, PROM flexion): Positive Painful Arc (Pain from 60 to 120 degrees scaption): Negative Empty Can: Positive External Rotation Resistance: Positive Horizontal Adduction: Not examined Scapular Assist: Not examined  Labral Tear Biceps Load II (120 elevation, full ER, 90 elbow flexion, full supination, resisted elbow flexion): Not examined Crank (160 scaption, axial load with IR/ER): Not examined O'Briens/Active Compression Test (90 shoulder flexion, 10 adduction, full IR): Not examined  Bicep Tendon Pathology Speed (shoulder flexion to 90, external rotation, full elbow extension, and forearm supination with resistance: Not examined Yergason's (resisted shoulder ER and supination/biceps tendon pathology): Not examined  Shoulder Instability Sulcus Sign: Not examined Anterior Apprehension: Not examined  Beighton scale Deferred, no history of hyperflexibility   TODAY'S TREATMENT:    SUBJECTIVE: Patient reports she is doing well today. Notices continued improvement in L shoulder flexion and abduction ROM. No resting pain upon arrival   PAIN: Denies resting pain;   Ther-ex  UBE x 5 minute (2.5 min forward/2.5 min backwards) at start of session for warm-up AAROM, and strengthening during interval history.  Seated pulleys for L shoulder AAROM flexion and scaption x multiple bouts each with end range hold; Standing pulleys for L shoulder AAROM IR x multiple bouts with end range hold; Supine L shoulder flexion from neutral to end range flexion (overhead) with 3# dumbbell x 10; Supine L shoulder serratus punch at 90 flexion with 3# dumbbell x 10; Supine L shoulder rhythmic stabilization at 90 flexion x 30s; HEP updated and  reviewed with patient;   Manual  Therapy   PROM L shoulder abduction, flexion, and ER x 15 minutes   AAROM  Right Left  Shoulder (Front initial evaluation)   Flexion 173 155*  Extension WNL   Abduction 166* (pain with OP) 106*  External Rotation 90 58*  Internal Rotation 68 60*  Hands Behind Head T5   Hands Behind Back T7    L GH AP mobilizations at neutral, grade II, 30s/bout x 3 bouts; L GH AP mobilizations at 90 abduction and available end range ER, grade II, 30s/bout x 3 bouts; Cold pack applied to L shoulder at end of session (unbilled);   Not performed: Seated forward and L lateral table slides to improve L shoulder flexion and abduction respectively;   PATIENT EDUCATION:  Education details: Plan of care and updated HEP; Person educated: Patient Education method: Explanation Education comprehension: verbalized understanding   HOME EXERCISE PROGRAM:  Access Code: [email or web address] URL: [email or web address] Date: 08/20/2023 Prepared by: Ria Comment  Exercises - Standing Isometric Shoulder Internal Rotation at Doorway  - 1-2 x daily - 3-5 x weekly - 2 sets - 10 reps - Standing Isometric Shoulder External Rotation with Doorway  - 1-2 x daily - 3-5 x weekly - 2 sets - 10 reps - Standing Isometric Shoulder Flexion with Doorway - Arm Bent  - 1-2 x daily - 3-5 x weekly - 2 sets - 10 reps - Standing Isometric Shoulder Abduction with Doorway - Arm Bent  - 1-2 x daily - 3-5 x weekly - 2 sets - 10 reps - Seated Shoulder Flexion AAROM with Pulley Behind  - 5-7 x weekly - 1-2 sets - 10 reps - Seated Shoulder Abduction AAROM with Pulley Behind  - 7 x weekly - 1-2 sets - 10 reps - Standing Shoulder Internal Rotation AAROM with Pulley (Mirrored)  - 1-2 x daily - 7 x weekly - 10 reps - Seated Shoulder Flexion Slide at Table Top with Forearm in Neutral  - 7 x weekly - 1-2 sets - 10 reps - Seated Shoulder Abduction Towel Slide at Table Top  - 7 x weekly - 1-2 sets - 10 reps   ASSESSMENT:  CLINICAL  IMPRESSION:   Patient arrives to treatment session motivated to participate with no complaints of pain at rest. Session focused on manual interventions, AAROM, and progressive strengthening. HEP updated during session today with instructions provided. Added pulleys for IR and removed table slides. Re-measured L shoulder AAROM with improvements noted in L shoulder abduction, ER, and IR. Pt encouraged to follow-up as scheduled. She will continue to benefit from skilled therapy to address remaining deficits in order to improve quality of life and return to prior level of function.   OBJECTIVE IMPAIRMENTS: decreased ROM, decreased strength, and pain.   ACTIVITY LIMITATIONS: carrying, lifting, dressing, and reach over head  PARTICIPATION LIMITATIONS: meal prep, cleaning, shopping, community activity, and occupation  PERSONAL FACTORS: Past/current experiences, Time since onset of injury/illness/exacerbation, and 1 comorbidity: DM  are also affecting patient's functional outcome.   REHAB POTENTIAL: Excellent  CLINICAL DECISION MAKING: Stable/uncomplicated  EVALUATION COMPLEXITY: Low   GOALS: Goals reviewed with patient? Yes  SHORT TERM GOALS: Target date: 09/05/2023  Pt will be independent with HEP to improve strength and decrease shoulder pain to improve pain-free function at home and work. Baseline:  Goal status: INITIAL   LONG TERM GOALS: Target date: 10/03/2023  Pt will increase FOTO to at least 67 to demonstrate significant improvement in function at home  and work related to shoulder pain  Baseline: 54 Goal status: INITIAL  2.  Pt will decrease worst shoulder pain by at least 3 points on the NPRS in order to demonstrate clinically significant reduction in shoulder pain. Baseline: 7/10; Goal status: INITIAL  3.  Pt will decrease quick DASH score by at least 8% in order to demonstrate clinically significant reduction in disability related to shoulder pain        Baseline: 20.5% Goal  status: INITIAL  4. Pt will increase L shoulder pain-free AROM abduction and external rotation range of motion to within 10 degrees of R shoulder in order to demonstrate improvement in functional range of motion.         Baseline: abd/ER: 92/47 both with pain Goal status: INITIAL   PLAN: PT FREQUENCY: 1-2x/week  PT DURATION: 8 weeks  PLANNED INTERVENTIONS: Therapeutic exercises, Therapeutic activity, Neuromuscular re-education, Balance training, Gait training, Patient/Family education, Self Care, Joint mobilization, Joint manipulation, Vestibular training, Canalith repositioning, Orthotic/Fit training, DME instructions, Dry Needling, Electrical stimulation, Spinal manipulation, Spinal mobilization, Cryotherapy, Moist heat, Taping, Traction, Ultrasound, Ionotophoresis 4mg /ml Dexamethasone, Manual therapy, and Re-evaluation.  PLAN FOR NEXT SESSION:  range of motion and manual therapy, consider TDN, progressive strengthening when appropriate  Sharalyn Ink Amarrah Meinhart PT, DPT, GCS  08/20/2023, 9:04 AM

## 2023-08-20 ENCOUNTER — Ambulatory Visit: Payer: No Typology Code available for payment source

## 2023-08-20 DIAGNOSIS — M25512 Pain in left shoulder: Secondary | ICD-10-CM | POA: Diagnosis not present

## 2023-08-20 DIAGNOSIS — G8929 Other chronic pain: Secondary | ICD-10-CM

## 2023-08-20 NOTE — Therapy (Signed)
OUTPATIENT PHYSICAL THERAPY SHOULDER TREATMENT  Patient Name: Heather Golden MRN: [id number] DOB:10-Jan-1966, 57 y.o., female Today's Date: 08/22/2023  END OF SESSION:  PT End of Session - 08/22/23 0757     Visit Number 5    Number of Visits 17    Date for PT Re-Evaluation 10/03/23    Authorization Type eval: 08/08/23    PT Start Time 0800    PT Stop Time 0847    PT Time Calculation (min) 47 min    Activity Tolerance Patient tolerated treatment well    Behavior During Therapy [facility] for tasks assessed/performed            Past Medical History:  Diagnosis Date   Diabetes mellitus without complication (HCC)    controlled - no meds   Past Surgical History:  Procedure Laterality Date   BACK SURGERY     GASTROPLASTY DUODENAL SWITCH     Patient Active Problem List   Diagnosis Date Noted   Dysuria 04/02/2023   Recurrent UTI 04/02/2023   PCP: Rossie Muskrat MD  REFERRING PROVIDER: Tiana Loft FNP  REFERRING DIAG: M75.02 (ICD-10-CM) - Adhesive capsulitis of left shoulder   RATIONALE FOR EVALUATION AND TREATMENT: Rehabilitation  THERAPY DIAG: Chronic left shoulder pain  Stiffness of left shoulder, not elsewhere classified  ONSET DATE: At least 6 months ago  FOLLOW-UP APPT SCHEDULED WITH REFERRING PROVIDER: Yes    FROM INITIAL EVALUATION SUBJECTIVE:                                                                                                                                                                                         SUBJECTIVE STATEMENT:  L shoulder ROM limitations and pain  PERTINENT HISTORY:  Pt reports progressive loss of L shoulder ROM over the period of at least 6 months. Her most limited motions are external rotation, abduction, and horizontal abduction. She was diagnosed with adhesive capsulitis and given her a steroid injection on 06/22/23. Since the steroid injection she has noticed an improvement in her range of motion and pain. She has a remote  history of L shoulder limitations and pain when reaching overhead. PMH includes DM and bariatric surgery. She has lost a significant amount of weight recently since she started taking Wegovy.   PAIN:  Pain Intensity: Present: 0/10, Best: 0/10, Worst: 7/10 Pain location: Generalized L shoulder pain radiating into the L upper arm Pain Quality: Intermittent sharp pain, occasional throbbing pain  Radiating: Yes, into L upper arm Numbness/Tingling: No Focal Weakness: No Aggravating factors: Holding steering wheel, sitting at her desk, horizontal abduction such as when trying to sleep. Relieving factors: steroid injection, avoiding aggravating  positions 24-hour pain behavior: notices pain more at night due to aggravating positions, pain wakes her up if she rolls onto L side History of prior shoulder or neck/shoulder injury, pain, surgery, or therapy: No Falls: Has patient fallen in last 6 months? No, Dominant hand: left Imaging: Yes, plain films but results unavailable for review. Per pt no issues identified on radiographs Red flags (personal history of cancer, chills/fever, night sweats, nausea, vomiting, unrelenting pain, unexplained weight gain/loss): Negative  PRECAUTIONS: None  WEIGHT BEARING RESTRICTIONS: No  FALLS: Has patient fallen in last 6 months? No  Living Environment Lives with: lives alone  Prior level of function: Independent  Occupational demands: Psychologist, educational  Hobbies: travel, reading, streaming shows/movies, live music  Patient Goals: Sleep on L side, put luggage in overhead bin, reach into cabinets   OBJECTIVE:   Patient Surveys  FOTO: 54, predicted improvement to 65 QuickDASH: To be completed  Cognition Patient is oriented to person, place, and time.  Recent memory is intact.  Remote memory is intact.  Attention span and concentration are intact.  Expressive speech is intact.  Patient's fund of knowledge is within normal limits for educational  level.    Gross Musculoskeletal Assessment Tremor: None Bulk: Normal Tone: Not tested  Gait Deferred  Posture No gross deficits identified contributing to symptoms  Cervical Screen AROM: WFL and painless with overpressure in all planes. No reproduction of shoulder pain. Pt reports some L upper trap tightness with L cervical rotation. Spurlings A (ipsilateral lateral flexion/axial compression): R: Negative L: Negative Spurlings B (ipsilateral lateral flexion/contralateral rotation/axial compression): R: Negative L: Negative Repeated movement: Deferred Hoffman Sign (cervical cord compression): R: Not examined L: Not examined ULTT Median: R: Not examined L: Not examined ULTT Ulnar: R: Not examined L: Not examined ULTT Radial: R: Not examined L: Not examined  AROM AROM (Normal range in degrees) AROM  Cervical  Flexion (50) WNL  Extension (80) Slight limitation, no pain  Right lateral flexion (45) WNL  Left lateral flexion (45) WNL  Right rotation (85) WNL  Left rotation (85) WNL* (L upper trap pain)   Right Left  Shoulder    Flexion 173 155*  Extension WNL WNL  Abduction 166* (pain with OP) 92*  External Rotation 90 47*  Internal Rotation 68 35*  Hands Behind Head T5 T1*  Hands Behind Back T7 Waistline*      Elbow    Flexion WNL WNL  Extension WNL WNL  Pronation    Supination    (* = pain; Blank rows = not tested)  UE MMT: MMT (out of 5) Right Left   Shoulder   Flexion 5 5  Extension 4 4  Abduction 5 5*  External rotation 5 5  Internal rotation 5 5*  Horizontal abduction 4 3+*  Horizontal adduction    Lower Trapezius 4 Unable to test  Rhomboids 4 4*      Elbow  Flexion 5 5  Extension 5 5  Pronation    Supination        Wrist  Flexion 5 5  Extension 5 5  Radial deviation    Ulnar deviation        MCP  Flexion 5 5  Extension 5 5  Abduction    Adduction    (* = pain; Blank rows = not tested)  Sensation Grossly intact to light touch bilateral  UE as determined by testing dermatomes C2-T2. Proprioception and hot/cold testing deferred on this date.  Reflexes Deferred  Palpation Location LEFT  RIGHT           Subocciptials 0   Cervical paraspinals 0   Upper Trapezius 0   Levator Scapulae 0   Rhomboid Major/Minor    Sternoclavicular joint 0   Acromioclavicular joint 0   Coracoid process 0   Long head of biceps 1   Supraspinatus 0   Infraspinatus 1   Subscapularis    Teres Minor    Teres Major    Pectoralis Major 0   Pectoralis Minor    Anterior Deltoid 0   Lateral Deltoid 0   Posterior Deltoid 1   Latissimus Dorsi    Sternocleidomastoid    (Blank rows = not tested) Graded on 0-4 scale (0 = no pain, 1 = pain, 2 = pain with wincing/grimacing/flinching, 3 = pain with withdrawal, 4 = unwilling to allow palpation), (Blank rows = not tested)  Passive Accessory Intervertebral Motion Pt denies reproduction of shoulder pain with CPA C2-T7 and UPA bilaterally C2-T7. Generally, hypomobile throughout  Accessory Motions/Glides Glenohumeral: Posterior: R: not examined L: abnormal severely restricted with pain Inferior: R: not examined L: abnormal, hypomobile but no pain Anterior: R: not examined L: normal  Acromioclavicular:  Posterior: R: not examined L: normal Anterior: R: not examined L: normal  Sternoclavicular: Posterior: R: not examined L: normal Anterior: R: not examined L: normal Superior: R: not examined L: normal Inferior: R: not examined L: normal  Scapulothoracic: Distraction: R: not examined L: not examined Medial: R: not examined L: not examined Lateral: R: not examined L: not examined Inferior: R: not examined L: not examined Superior: R: not examined L: not examined Upward Rotation: R: not examined L: not examined Downward Rotation: R: not examined L: not examined  Muscle Length Testing Pectoralis Major: R: not examined L: normal Pectoralis Minor: R: not examined L: not examined Biceps: R: not  examined L: not examined  SPECIAL TESTS Rotator Cuff  Drop Arm Test: Negative Painful Arc (Pain from 60 to 120 degrees scaption): Negative Infraspinatus Muscle Test: Positive for pain  Subacromial Impingement Hawkins-Kennedy: Not examined Neer (Block scapula, PROM flexion): Positive Painful Arc (Pain from 60 to 120 degrees scaption): Negative Empty Can: Positive External Rotation Resistance: Positive Horizontal Adduction: Not examined Scapular Assist: Not examined  Labral Tear Biceps Load II (120 elevation, full ER, 90 elbow flexion, full supination, resisted elbow flexion): Not examined Crank (160 scaption, axial load with IR/ER): Not examined O'Briens/Active Compression Test (90 shoulder flexion, 10 adduction, full IR): Not examined  Bicep Tendon Pathology Speed (shoulder flexion to 90, external rotation, full elbow extension, and forearm supination with resistance: Not examined Yergason's (resisted shoulder ER and supination/biceps tendon pathology): Not examined  Shoulder Instability Sulcus Sign: Not examined Anterior Apprehension: Not examined  Beighton scale Deferred, no history of hyperflexibility   TODAY'S TREATMENT:    SUBJECTIVE: Patient reports she is doing well today. Notices continued improvement in L shoulder flexion and abduction ROM. No resting pain upon arrival   PAIN: Denies resting pain;   Ther-ex  UBE x 5 minute (2.5 min forward/2.5 min backwards) at start of session for warm-up AAROM, and strengthening during interval history.  Seated pulleys for L shoulder AAROM flexion and scaption x multiple bouts each with end range hold; Standing pulleys for L shoulder AAROM IR x multiple bouts with end range hold; Standing doorway stretch for L shoulder ER with elbow at side 2 x 30s (added to HEP); Seated Nautilus lat pull down for overhead stretch as well  as strengthening 40# 2 x 10; HEP updated and reviewed with patient;   Manual Therapy   PROM L  shoulder abduction, flexion, and ER; L GH AP mobilizations at neutral, grade III, 30s/bout x 3 bouts; L GH inferior mobilizations at 90 abduction grade III, 30s/bout x 3 bouts; L GH AP mobilizations at 90 abduction and available end range ER, grade II, 30s/bout x 3 bouts; Cold pack applied to L shoulder at end of session during HEP review;   Not performed: Seated forward and L lateral table slides to improve L shoulder flexion and abduction respectively;   PATIENT EDUCATION:  Education details: Plan of care and updated HEP; Person educated: Patient Education method: Explanation Education comprehension: verbalized understanding   HOME EXERCISE PROGRAM:  Access Code: [email or web address] URL: [email or web address] Date: 08/22/2023 Prepared by: Ria Comment  Exercises - Standing Isometric Shoulder Internal Rotation at Doorway  - 1-2 x daily - 3-5 x weekly - 2 sets - 10 reps - Standing Isometric Shoulder External Rotation with Doorway  - 1-2 x daily - 3-5 x weekly - 2 sets - 10 reps - Standing Isometric Shoulder Flexion with Doorway - Arm Bent  - 1-2 x daily - 3-5 x weekly - 2 sets - 10 reps - Standing Isometric Shoulder Abduction with Doorway - Arm Bent  - 1-2 x daily - 3-5 x weekly - 2 sets - 10 reps - Seated Shoulder Flexion AAROM with Pulley Behind  - 5-7 x weekly - 1-2 sets - 10 reps - Seated Shoulder Abduction AAROM with Pulley Behind  - 7 x weekly - 1-2 sets - 10 reps - Standing Shoulder Internal Rotation AAROM with Pulley (Mirrored)  - 1-2 x daily - 7 x weekly - 10 reps - Seated Shoulder Flexion Slide at Table Top with Forearm in Neutral  - 7 x weekly - 1-2 sets - 10 reps - Seated Shoulder Abduction Towel Slide at Table Top  - 7 x weekly - 1-2 sets - 10 reps - Standing Shoulder External Rotation Stretch in Doorway  - 1-2 x daily - 7 x weekly - 3-5 reps - 30s hold   ASSESSMENT:  CLINICAL IMPRESSION:   Patient arrives to treatment session motivated to participate with no  complaints of pain at rest. Session focused on manual interventions, AAROM, and progressive strengthening. HEP updated during session today with instructions provided. Added doorway stretch for L shoulder ER. Pt encouraged to follow-up as scheduled. She will continue to benefit from skilled therapy to address remaining deficits in order to improve quality of life and return to prior level of function.   OBJECTIVE IMPAIRMENTS: decreased ROM, decreased strength, and pain.   ACTIVITY LIMITATIONS: carrying, lifting, dressing, and reach over head  PARTICIPATION LIMITATIONS: meal prep, cleaning, shopping, community activity, and occupation  PERSONAL FACTORS: Past/current experiences, Time since onset of injury/illness/exacerbation, and 1 comorbidity: DM  are also affecting patient's functional outcome.   REHAB POTENTIAL: Excellent  CLINICAL DECISION MAKING: Stable/uncomplicated  EVALUATION COMPLEXITY: Low   GOALS: Goals reviewed with patient? Yes  SHORT TERM GOALS: Target date: 09/05/2023  Pt will be independent with HEP to improve strength and decrease shoulder pain to improve pain-free function at home and work. Baseline:  Goal status: INITIAL   LONG TERM GOALS: Target date: 10/03/2023  Pt will increase FOTO to at least 67 to demonstrate significant improvement in function at home and work related to shoulder pain  Baseline: 54 Goal status: INITIAL  2.  Pt will decrease worst shoulder pain by  at least 3 points on the NPRS in order to demonstrate clinically significant reduction in shoulder pain. Baseline: 7/10; Goal status: INITIAL  3.  Pt will decrease quick DASH score by at least 8% in order to demonstrate clinically significant reduction in disability related to shoulder pain        Baseline: 20.5% Goal status: INITIAL  4. Pt will increase L shoulder pain-free AROM abduction and external rotation range of motion to within 10 degrees of R shoulder in order to demonstrate  improvement in functional range of motion.         Baseline: abd/ER: 92/47 both with pain Goal status: INITIAL   PLAN: PT FREQUENCY: 1-2x/week  PT DURATION: 8 weeks  PLANNED INTERVENTIONS: Therapeutic exercises, Therapeutic activity, Neuromuscular re-education, Balance training, Gait training, Patient/Family education, Self Care, Joint mobilization, Joint manipulation, Vestibular training, Canalith repositioning, Orthotic/Fit training, DME instructions, Dry Needling, Electrical stimulation, Spinal manipulation, Spinal mobilization, Cryotherapy, Moist heat, Taping, Traction, Ultrasound, Ionotophoresis 4mg /ml Dexamethasone, Manual therapy, and Re-evaluation.  PLAN FOR NEXT SESSION:  range of motion and manual therapy, consider TDN, progressive strengthening when appropriate  Sharalyn Ink Marrion Accomando PT, DPT, GCS  08/22/2023, 10:01 AM

## 2023-08-22 ENCOUNTER — Ambulatory Visit: Payer: No Typology Code available for payment source

## 2023-08-22 DIAGNOSIS — M25612 Stiffness of left shoulder, not elsewhere classified: Secondary | ICD-10-CM

## 2023-08-22 DIAGNOSIS — G8929 Other chronic pain: Secondary | ICD-10-CM

## 2023-08-22 DIAGNOSIS — M25512 Pain in left shoulder: Secondary | ICD-10-CM | POA: Diagnosis not present

## 2023-08-29 ENCOUNTER — Ambulatory Visit: Payer: No Typology Code available for payment source

## 2023-08-29 DIAGNOSIS — G8929 Other chronic pain: Secondary | ICD-10-CM

## 2023-08-29 DIAGNOSIS — M25612 Stiffness of left shoulder, not elsewhere classified: Secondary | ICD-10-CM

## 2023-08-29 DIAGNOSIS — M25512 Pain in left shoulder: Secondary | ICD-10-CM | POA: Diagnosis not present

## 2023-08-29 NOTE — Therapy (Signed)
OUTPATIENT PHYSICAL THERAPY SHOULDER TREATMENT  Patient Name: Heather Golden MRN: [id number] DOB:[birth year], 57 y.o., female Today's Date: 08/29/2023  END OF SESSION:  PT End of Session - 08/29/23 0755     Visit Number 6    Number of Visits 17    Date for PT Re-Evaluation 10/03/23    Authorization Type eval: 08/08/23    PT Start Time 0800    PT Stop Time 0845    PT Time Calculation (min) 45 min    Activity Tolerance Patient tolerated treatment well    Behavior During Therapy WFL for tasks assessed/performed            Past Medical History:  Diagnosis Date   Diabetes mellitus without complication (HCC)    controlled - no meds   Past Surgical History:  Procedure Laterality Date   BACK SURGERY     GASTROPLASTY DUODENAL SWITCH     Patient Active Problem List   Diagnosis Date Noted   Dysuria 04/02/2023   Recurrent UTI 04/02/2023   PCP: Rossie Muskrat MD  REFERRING PROVIDER: Tiana Loft FNP  REFERRING DIAG: M75.02 (ICD-10-CM) - Adhesive capsulitis of left shoulder   RATIONALE FOR EVALUATION AND TREATMENT: Rehabilitation  THERAPY DIAG: Chronic left shoulder pain  Stiffness of left shoulder, not elsewhere classified  ONSET DATE: At least 6 months ago  FOLLOW-UP APPT SCHEDULED WITH REFERRING PROVIDER: Yes    FROM INITIAL EVALUATION SUBJECTIVE:                                                                                                                                                                                         SUBJECTIVE STATEMENT:  L shoulder ROM limitations and pain  PERTINENT HISTORY:  Pt reports progressive loss of L shoulder ROM over the period of at least 6 months. Her most limited motions are external rotation, abduction, and horizontal abduction. She was diagnosed with adhesive capsulitis and given her a steroid injection on 06/22/23. Since the steroid injection she has noticed an improvement in her range of motion and pain. She has a remote  history of L shoulder limitations and pain when reaching overhead. PMH includes DM and bariatric surgery. She has lost a significant amount of weight recently since she started taking Wegovy.   PAIN:  Pain Intensity: Present: 0/10, Best: 0/10, Worst: 7/10 Pain location: Generalized L shoulder pain radiating into the L upper arm Pain Quality: Intermittent sharp pain, occasional throbbing pain  Radiating: Yes, into L upper arm Numbness/Tingling: No Focal Weakness: No Aggravating factors: Holding steering wheel, sitting at her desk, horizontal abduction such as when trying to sleep. Relieving factors: steroid injection, avoiding aggravating  positions 24-hour pain behavior: notices pain more at night due to aggravating positions, pain wakes her up if she rolls onto L side History of prior shoulder or neck/shoulder injury, pain, surgery, or therapy: No Falls: Has patient fallen in last 6 months? No, Dominant hand: left Imaging: Yes, plain films but results unavailable for review. Per pt no issues identified on radiographs Red flags (personal history of cancer, chills/fever, night sweats, nausea, vomiting, unrelenting pain, unexplained weight gain/loss): Negative  PRECAUTIONS: None  WEIGHT BEARING RESTRICTIONS: No  FALLS: Has patient fallen in last 6 months? No  Living Environment Lives with: lives alone  Prior level of function: Independent  Occupational demands: Psychologist, educational  Hobbies: travel, reading, streaming shows/movies, live music  Patient Goals: Sleep on L side, put luggage in overhead bin, reach into cabinets   OBJECTIVE:   Patient Surveys  FOTO: 54, predicted improvement to 5 QuickDASH: To be completed  Cognition Patient is oriented to person, place, and time.  Recent memory is intact.  Remote memory is intact.  Attention span and concentration are intact.  Expressive speech is intact.  Patient's fund of knowledge is within normal limits for educational  level.    Gross Musculoskeletal Assessment Tremor: None Bulk: Normal Tone: Not tested  Gait Deferred  Posture No gross deficits identified contributing to symptoms  Cervical Screen AROM: WFL and painless with overpressure in all planes. No reproduction of shoulder pain. Pt reports some L upper trap tightness with L cervical rotation. Spurlings A (ipsilateral lateral flexion/axial compression): R: Negative L: Negative Spurlings B (ipsilateral lateral flexion/contralateral rotation/axial compression): R: Negative L: Negative Repeated movement: Deferred Hoffman Sign (cervical cord compression): R: Not examined L: Not examined ULTT Median: R: Not examined L: Not examined ULTT Ulnar: R: Not examined L: Not examined ULTT Radial: R: Not examined L: Not examined  AROM AROM (Normal range in degrees) AROM  Cervical  Flexion (50) WNL  Extension (80) Slight limitation, no pain  Right lateral flexion (45) WNL  Left lateral flexion (45) WNL  Right rotation (85) WNL  Left rotation (85) WNL* (L upper trap pain)   Right Left  Shoulder    Flexion 173 155*  Extension WNL WNL  Abduction 166* (pain with OP) 92*  External Rotation 90 47*  Internal Rotation 68 35*  Hands Behind Head T5 T1*  Hands Behind Back T7 Waistline*      Elbow    Flexion WNL WNL  Extension WNL WNL  Pronation    Supination    (* = pain; Blank rows = not tested)  UE MMT: MMT (out of 5) Right Left   Shoulder   Flexion 5 5  Extension 4 4  Abduction 5 5*  External rotation 5 5  Internal rotation 5 5*  Horizontal abduction 4 3+*  Horizontal adduction    Lower Trapezius 4 Unable to test  Rhomboids 4 4*      Elbow  Flexion 5 5  Extension 5 5  Pronation    Supination        Wrist  Flexion 5 5  Extension 5 5  Radial deviation    Ulnar deviation        MCP  Flexion 5 5  Extension 5 5  Abduction    Adduction    (* = pain; Blank rows = not tested)  Sensation Grossly intact to light touch bilateral  UE as determined by testing dermatomes C2-T2. Proprioception and hot/cold testing deferred on this date.  Reflexes Deferred  Palpation Location LEFT  RIGHT           Subocciptials 0   Cervical paraspinals 0   Upper Trapezius 0   Levator Scapulae 0   Rhomboid Major/Minor    Sternoclavicular joint 0   Acromioclavicular joint 0   Coracoid process 0   Long head of biceps 1   Supraspinatus 0   Infraspinatus 1   Subscapularis    Teres Minor    Teres Major    Pectoralis Major 0   Pectoralis Minor    Anterior Deltoid 0   Lateral Deltoid 0   Posterior Deltoid 1   Latissimus Dorsi    Sternocleidomastoid    (Blank rows = not tested) Graded on 0-4 scale (0 = no pain, 1 = pain, 2 = pain with wincing/grimacing/flinching, 3 = pain with withdrawal, 4 = unwilling to allow palpation), (Blank rows = not tested)  Passive Accessory Intervertebral Motion Pt denies reproduction of shoulder pain with CPA C2-T7 and UPA bilaterally C2-T7. Generally, hypomobile throughout  Accessory Motions/Glides Glenohumeral: Posterior: R: not examined L: abnormal severely restricted with pain Inferior: R: not examined L: abnormal, hypomobile but no pain Anterior: R: not examined L: normal  Acromioclavicular:  Posterior: R: not examined L: normal Anterior: R: not examined L: normal  Sternoclavicular: Posterior: R: not examined L: normal Anterior: R: not examined L: normal Superior: R: not examined L: normal Inferior: R: not examined L: normal  Scapulothoracic: Distraction: R: not examined L: not examined Medial: R: not examined L: not examined Lateral: R: not examined L: not examined Inferior: R: not examined L: not examined Superior: R: not examined L: not examined Upward Rotation: R: not examined L: not examined Downward Rotation: R: not examined L: not examined  Muscle Length Testing Pectoralis Major: R: not examined L: normal Pectoralis Minor: R: not examined L: not examined Biceps: R: not  examined L: not examined  SPECIAL TESTS Rotator Cuff  Drop Arm Test: Negative Painful Arc (Pain from 60 to 120 degrees scaption): Negative Infraspinatus Muscle Test: Positive for pain  Subacromial Impingement Hawkins-Kennedy: Not examined Neer (Block scapula, PROM flexion): Positive Painful Arc (Pain from 60 to 120 degrees scaption): Negative Empty Can: Positive External Rotation Resistance: Positive Horizontal Adduction: Not examined Scapular Assist: Not examined  Labral Tear Biceps Load II (120 elevation, full ER, 90 elbow flexion, full supination, resisted elbow flexion): Not examined Crank (160 scaption, axial load with IR/ER): Not examined O'Briens/Active Compression Test (90 shoulder flexion, 10 adduction, full IR): Not examined  Bicep Tendon Pathology Speed (shoulder flexion to 90, external rotation, full elbow extension, and forearm supination with resistance: Not examined Yergason's (resisted shoulder ER and supination/biceps tendon pathology): Not examined  Shoulder Instability Sulcus Sign: Not examined Anterior Apprehension: Not examined  Beighton scale Deferred, no history of hyperflexibility   TODAY'S TREATMENT:    SUBJECTIVE: Patient reports she is doing well today. No significant changes since the last therapy session. She has been performing her HEP and notes aching with L shoulder IR behind the back. No resting pain upon arrival   PAIN: Denies resting pain;   Ther-ex  UBE x 5 minute (2.5 min forward/2.5 min backwards) at start of session for warm-up AAROM, and strengthening during interval history.  Seated pulleys for L shoulder AAROM flexion and scaption x multiple bouts each with end range hold; Standing pulleys for L shoulder AAROM IR x multiple bouts with end range hold; Standing doorway stretch for L shoulder ER with elbow at side x 30s,  arm at shoulder height x 30s; Seated Nautilus lat pull down for overhead stretch as well as strengthening 40# x  10; Seated Nautilus rows 40# 2 x 10; HEP updated and reviewed with patient;   Manual Therapy   PROM L shoulder abduction, flexion, and ER; L GH AP mobilizations at neutral, grade III, 30s/bout x 3 bouts; L GH inferior mobilizations at 90 abduction grade III, 30s/bout x 3 bouts; L GH AP mobilizations at 90 abduction and available end range ER, grade II, 30s/bout x 3 bouts; Cold pack applied to L shoulder at end of session during HEP review;   Not performed: Seated forward and L lateral table slides to improve L shoulder flexion and abduction respectively;   PATIENT EDUCATION:  Education details: Plan of care and updated HEP; Person educated: Patient Education method: Explanation Education comprehension: verbalized understanding   HOME EXERCISE PROGRAM:  Access Code: [email or web address] URL: [email or web address] Date: 08/29/2023 Prepared by: Ria Comment  Exercises - Standing Isometric Shoulder Internal Rotation at Doorway  - 1-2 x daily - 3-5 x weekly - 2 sets - 10 reps - Standing Isometric Shoulder External Rotation with Doorway  - 1-2 x daily - 3-5 x weekly - 2 sets - 10 reps - Standing Isometric Shoulder Flexion with Doorway - Arm Bent  - 1-2 x daily - 3-5 x weekly - 2 sets - 10 reps - Standing Isometric Shoulder Abduction with Doorway - Arm Bent  - 1-2 x daily - 3-5 x weekly - 2 sets - 10 reps - Seated Shoulder Flexion AAROM with Pulley Behind  - 5-7 x weekly - 1-2 sets - 10 reps - Seated Shoulder Abduction AAROM with Pulley Behind  - 7 x weekly - 1-2 sets - 10 reps - Standing Shoulder Internal Rotation AAROM with Pulley (Mirrored)  - 1-2 x daily - 7 x weekly - 10 reps - Seated Shoulder Flexion Slide at Table Top with Forearm in Neutral  - 7 x weekly - 1-2 sets - 10 reps - Seated Shoulder Abduction Towel Slide at Table Top  - 7 x weekly - 1-2 sets - 10 reps - Standing Shoulder External Rotation Stretch in Doorway  - 1-2 x daily - 7 x weekly - 3-5 reps - 30s hold -  Single Arm Doorway Pec Stretch at 90 Degrees Abduction (Mirrored)  - 1-2 x daily - 7 x weekly - 3-5 reps - 30s hold   ASSESSMENT:  CLINICAL IMPRESSION:   Patient arrives to treatment session motivated to participate with no complaints of pain at rest. Session focused on manual interventions, AAROM, and progressive strengthening. HEP updated during session today with instructions provided. Added doorway stretch at 90 abduction for L shoulder ER. Pt encouraged to follow-up as scheduled. She will continue to benefit from skilled therapy to address remaining deficits in order to improve quality of life and return to prior level of function.   OBJECTIVE IMPAIRMENTS: decreased ROM, decreased strength, and pain.   ACTIVITY LIMITATIONS: carrying, lifting, dressing, and reach over head  PARTICIPATION LIMITATIONS: meal prep, cleaning, shopping, community activity, and occupation  PERSONAL FACTORS: Past/current experiences, Time since onset of injury/illness/exacerbation, and 1 comorbidity: DM  are also affecting patient's functional outcome.   REHAB POTENTIAL: Excellent  CLINICAL DECISION MAKING: Stable/uncomplicated  EVALUATION COMPLEXITY: Low   GOALS: Goals reviewed with patient? Yes  SHORT TERM GOALS: Target date: 09/05/2023  Pt will be independent with HEP to improve strength and decrease shoulder pain to improve pain-free function at home and work. Baseline:  Goal status: INITIAL   LONG TERM GOALS: Target date: 10/03/2023  Pt will increase FOTO to at least 67 to demonstrate significant improvement in function at home and work related to shoulder pain  Baseline: 54 Goal status: INITIAL  2.  Pt will decrease worst shoulder pain by at least 3 points on the NPRS in order to demonstrate clinically significant reduction in shoulder pain. Baseline: 7/10; Goal status: INITIAL  3.  Pt will decrease quick DASH score by at least 8% in order to demonstrate clinically significant reduction in  disability related to shoulder pain        Baseline: 20.5% Goal status: INITIAL  4. Pt will increase L shoulder pain-free AROM abduction and external rotation range of motion to within 10 degrees of R shoulder in order to demonstrate improvement in functional range of motion.         Baseline: abd/ER: 92/47 both with pain Goal status: INITIAL   PLAN: PT FREQUENCY: 1-2x/week  PT DURATION: 8 weeks  PLANNED INTERVENTIONS: Therapeutic exercises, Therapeutic activity, Neuromuscular re-education, Balance training, Gait training, Patient/Family education, Self Care, Joint mobilization, Joint manipulation, Vestibular training, Canalith repositioning, Orthotic/Fit training, DME instructions, Dry Needling, Electrical stimulation, Spinal manipulation, Spinal mobilization, Cryotherapy, Moist heat, Taping, Traction, Ultrasound, Ionotophoresis 4mg /ml Dexamethasone, Manual therapy, and Re-evaluation.  PLAN FOR NEXT SESSION:  range of motion and manual therapy, consider TDN, progressive strengthening when appropriate  Sharalyn Ink Shelvy Heckert PT, DPT, GCS  08/29/2023, 11:50 AM

## 2023-09-09 NOTE — Therapy (Signed)
 OUTPATIENT PHYSICAL THERAPY SHOULDER TREATMENT  Patient Name: Heather Golden MRN: [id number] DOB:16-Oct-1965, 58 y.o., female Today's Date: 09/10/2023  END OF SESSION:  PT End of Session - 09/10/23 0756     Visit Number 7    Number of Visits 17    Date for PT Re-Evaluation 10/03/23    Authorization Type eval: 08/08/23    PT Start Time 0800    PT Stop Time 0845    PT Time Calculation (min) 45 min    Activity Tolerance Patient tolerated treatment well    Behavior During Therapy WFL for tasks assessed/performed            Past Medical History:  Diagnosis Date   Diabetes mellitus without complication (HCC)    controlled - no meds   Past Surgical History:  Procedure Laterality Date   BACK SURGERY     GASTROPLASTY DUODENAL SWITCH     Patient Active Problem List   Diagnosis Date Noted   Dysuria 04/02/2023   Recurrent UTI 04/02/2023   PCP: Kimberlee Platt MD  REFERRING PROVIDER: Toribio Dar FNP  REFERRING DIAG: M75.02 (ICD-10-CM) - Adhesive capsulitis of left shoulder   RATIONALE FOR EVALUATION AND TREATMENT: Rehabilitation  THERAPY DIAG: Chronic left shoulder pain  Stiffness of left shoulder, not elsewhere classified  ONSET DATE: At least 6 months ago  FOLLOW-UP APPT SCHEDULED WITH REFERRING PROVIDER: Yes    FROM INITIAL EVALUATION SUBJECTIVE:                                                                                                                                                                                         SUBJECTIVE STATEMENT:  L shoulder ROM limitations and pain  PERTINENT HISTORY:  Pt reports progressive loss of L shoulder ROM over the period of at least 6 months. Her most limited motions are external rotation, abduction, and horizontal abduction. She was diagnosed with adhesive capsulitis and given her a steroid injection on 06/22/23. Since the steroid injection she has noticed an improvement in her range of motion and pain. She has a remote  history of L shoulder limitations and pain when reaching overhead. PMH includes DM and bariatric surgery. She has lost a significant amount of weight recently since she started taking Wegovy.   PAIN:  Pain Intensity: Present: 0/10, Best: 0/10, Worst: 7/10 Pain location: Generalized L shoulder pain radiating into the L upper arm Pain Quality: Intermittent sharp pain, occasional throbbing pain  Radiating: Yes, into L upper arm Numbness/Tingling: No Focal Weakness: No Aggravating factors: Holding steering wheel, sitting at her desk, horizontal abduction such as when trying to sleep. Relieving factors: steroid injection, avoiding aggravating  positions 24-hour pain behavior: notices pain more at night due to aggravating positions, pain wakes her up if she rolls onto L side History of prior shoulder or neck/shoulder injury, pain, surgery, or therapy: No Falls: Has patient fallen in last 6 months? No, Dominant hand: left Imaging: Yes, plain films but results unavailable for review. Per pt no issues identified on radiographs Red flags (personal history of cancer, chills/fever, night sweats, nausea, vomiting, unrelenting pain, unexplained weight gain/loss): Negative  PRECAUTIONS: None  WEIGHT BEARING RESTRICTIONS: No  FALLS: Has patient fallen in last 6 months? No  Living Environment Lives with: lives alone  Prior level of function: Independent  Occupational demands: Psychologist, Educational  Hobbies: travel, reading, streaming shows/movies, live music  Patient Goals: Sleep on L side, put luggage in overhead bin, reach into cabinets   OBJECTIVE:   Patient Surveys  FOTO: 54, predicted improvement to 62 QuickDASH: To be completed  Cognition Patient is oriented to person, place, and time.  Recent memory is intact.  Remote memory is intact.  Attention span and concentration are intact.  Expressive speech is intact.  Patient's fund of knowledge is within normal limits for educational  level.    Gross Musculoskeletal Assessment Tremor: None Bulk: Normal Tone: Not tested  Gait Deferred  Posture No gross deficits identified contributing to symptoms  Cervical Screen AROM: WFL and painless with overpressure in all planes. No reproduction of shoulder pain. Pt reports some L upper trap tightness with L cervical rotation. Spurlings A (ipsilateral lateral flexion/axial compression): R: Negative L: Negative Spurlings B (ipsilateral lateral flexion/contralateral rotation/axial compression): R: Negative L: Negative Repeated movement: Deferred Hoffman Sign (cervical cord compression): R: Not examined L: Not examined ULTT Median: R: Not examined L: Not examined ULTT Ulnar: R: Not examined L: Not examined ULTT Radial: R: Not examined L: Not examined  AROM AROM (Normal range in degrees) AROM  Cervical  Flexion (50) WNL  Extension (80) Slight limitation, no pain  Right lateral flexion (45) WNL  Left lateral flexion (45) WNL  Right rotation (85) WNL  Left rotation (85) WNL* (L upper trap pain)   Right Left  Shoulder    Flexion 173 155*  Extension WNL WNL  Abduction 166* (pain with OP) 92*  External Rotation 90 47*  Internal Rotation 68 35*  Hands Behind Head T5 T1*  Hands Behind Back T7 Waistline*      Elbow    Flexion WNL WNL  Extension WNL WNL  Pronation    Supination    (* = pain; Blank rows = not tested)  UE MMT: MMT (out of 5) Right Left   Shoulder   Flexion 5 5  Extension 4 4  Abduction 5 5*  External rotation 5 5  Internal rotation 5 5*  Horizontal abduction 4 3+*  Horizontal adduction    Lower Trapezius 4 Unable to test  Rhomboids 4 4*      Elbow  Flexion 5 5  Extension 5 5  Pronation    Supination        Wrist  Flexion 5 5  Extension 5 5  Radial deviation    Ulnar deviation        MCP  Flexion 5 5  Extension 5 5  Abduction    Adduction    (* = pain; Blank rows = not tested)  Sensation Grossly intact to light touch bilateral  UE as determined by testing dermatomes C2-T2. Proprioception and hot/cold testing deferred on this date.  Reflexes Deferred  Palpation Location LEFT  RIGHT           Subocciptials 0   Cervical paraspinals 0   Upper Trapezius 0   Levator Scapulae 0   Rhomboid Major/Minor    Sternoclavicular joint 0   Acromioclavicular joint 0   Coracoid process 0   Long head of biceps 1   Supraspinatus 0   Infraspinatus 1   Subscapularis    Teres Minor    Teres Major    Pectoralis Major 0   Pectoralis Minor    Anterior Deltoid 0   Lateral Deltoid 0   Posterior Deltoid 1   Latissimus Dorsi    Sternocleidomastoid    (Blank rows = not tested) Graded on 0-4 scale (0 = no pain, 1 = pain, 2 = pain with wincing/grimacing/flinching, 3 = pain with withdrawal, 4 = unwilling to allow palpation), (Blank rows = not tested)  Passive Accessory Intervertebral Motion Pt denies reproduction of shoulder pain with CPA C2-T7 and UPA bilaterally C2-T7. Generally, hypomobile throughout  Accessory Motions/Glides Glenohumeral: Posterior: R: not examined L: abnormal severely restricted with pain Inferior: R: not examined L: abnormal, hypomobile but no pain Anterior: R: not examined L: normal  Acromioclavicular:  Posterior: R: not examined L: normal Anterior: R: not examined L: normal  Sternoclavicular: Posterior: R: not examined L: normal Anterior: R: not examined L: normal Superior: R: not examined L: normal Inferior: R: not examined L: normal  Scapulothoracic: Distraction: R: not examined L: not examined Medial: R: not examined L: not examined Lateral: R: not examined L: not examined Inferior: R: not examined L: not examined Superior: R: not examined L: not examined Upward Rotation: R: not examined L: not examined Downward Rotation: R: not examined L: not examined  Muscle Length Testing Pectoralis Major: R: not examined L: normal Pectoralis Minor: R: not examined L: not examined Biceps: R: not  examined L: not examined  SPECIAL TESTS Rotator Cuff  Drop Arm Test: Negative Painful Arc (Pain from 60 to 120 degrees scaption): Negative Infraspinatus Muscle Test: Positive for pain  Subacromial Impingement Hawkins-Kennedy: Not examined Neer (Block scapula, PROM flexion): Positive Painful Arc (Pain from 60 to 120 degrees scaption): Negative Empty Can: Positive External Rotation Resistance: Positive Horizontal Adduction: Not examined Scapular Assist: Not examined  Labral Tear Biceps Load II (120 elevation, full ER, 90 elbow flexion, full supination, resisted elbow flexion): Not examined Crank (160 scaption, axial load with IR/ER): Not examined O'Briens/Active Compression Test (90 shoulder flexion, 10 adduction, full IR): Not examined  Bicep Tendon Pathology Speed (shoulder flexion to 90, external rotation, full elbow extension, and forearm supination with resistance: Not examined Yergason's (resisted shoulder ER and supination/biceps tendon pathology): Not examined  Shoulder Instability Sulcus Sign: Not examined Anterior Apprehension: Not examined  Beighton scale Deferred, no history of hyperflexibility   TODAY'S TREATMENT:    SUBJECTIVE: Patient reports she is doing well today. No significant changes since the last therapy session. She was traveling last week and was not able to be as consistent with her HEP. No resting pain upon arrival   PAIN: Denies resting pain;   Ther-ex  UBE x 5 minute (2.5 min forward/2.5 min backwards) at start of session for warm-up AAROM, and strengthening during interval history.  Seated pulleys for L shoulder AAROM flexion and scaption x multiple bouts each with end range hold; Standing pulleys for L shoulder AAROM IR x multiple bouts with end range hold; Standing doorway stretch for L shoulder ER with elbow at side x 60s,  arm at shoulder height x 60s; Supine L shoulder serratus punch with manual resistance from therapist 2 x 10; Supine  L shoulder rhythmic stabilization with shoulder at 90 degrees flexion 2 x 30s;  Supine L shoulder flexion from neutral to 90 with 3# dumbbell 2 x 10;   Manual Therapy   PROM L shoulder abduction, flexion, and ER; L GH AP mobilizations at neutral, grade III, 30s/bout x 3 bouts; L GH inferior mobilizations at 90 abduction grade III, 30s/bout x 3 bouts; L GH AP mobilizations at 90 abduction and available end range ER, grade II, 30s/bout x 3 bouts;   Not performed: Seated forward and L lateral table slides to improve L shoulder flexion and abduction respectively; Seated Nautilus lat pull down for overhead stretch as well as strengthening 40# x 10; Seated Nautilus rows 40# 2 x 10;   PATIENT EDUCATION:  Education details: Plan of care and updated HEP; Person educated: Patient Education method: Explanation Education comprehension: verbalized understanding   HOME EXERCISE PROGRAM:  Access Code: [email or web address] URL: [email or web address] Date: 08/29/2023 Prepared by: Selinda Eck  Exercises - Standing Isometric Shoulder Internal Rotation at Doorway  - 1-2 x daily - 3-5 x weekly - 2 sets - 10 reps - Standing Isometric Shoulder External Rotation with Doorway  - 1-2 x daily - 3-5 x weekly - 2 sets - 10 reps - Standing Isometric Shoulder Flexion with Doorway - Arm Bent  - 1-2 x daily - 3-5 x weekly - 2 sets - 10 reps - Standing Isometric Shoulder Abduction with Doorway - Arm Bent  - 1-2 x daily - 3-5 x weekly - 2 sets - 10 reps - Seated Shoulder Flexion AAROM with Pulley Behind  - 5-7 x weekly - 1-2 sets - 10 reps - Seated Shoulder Abduction AAROM with Pulley Behind  - 7 x weekly - 1-2 sets - 10 reps - Standing Shoulder Internal Rotation AAROM with Pulley (Mirrored)  - 1-2 x daily - 7 x weekly - 10 reps - Seated Shoulder Flexion Slide at Table Top with Forearm in Neutral  - 7 x weekly - 1-2 sets - 10 reps - Seated Shoulder Abduction Towel Slide at Table Top  - 7 x weekly - 1-2 sets  - 10 reps - Standing Shoulder External Rotation Stretch in Doorway  - 1-2 x daily - 7 x weekly - 3-5 reps - 30s hold - Single Arm Doorway Pec Stretch at 90 Degrees Abduction (Mirrored)  - 1-2 x daily - 7 x weekly - 3-5 reps - 30s hold   ASSESSMENT:  CLINICAL IMPRESSION:   Patient arrives to treatment session motivated to participate with no complaints of pain at rest. Session focused on manual interventions, AAROM, and progressive strengthening. No HEP updates during session today. L shoulder flexion and ER appear to be improving throughout session. Pt encouraged to follow-up as scheduled. She will continue to benefit from skilled therapy to address remaining deficits in order to improve quality of life and return to prior level of function.   OBJECTIVE IMPAIRMENTS: decreased ROM, decreased strength, and pain.   ACTIVITY LIMITATIONS: carrying, lifting, dressing, and reach over head  PARTICIPATION LIMITATIONS: meal prep, cleaning, shopping, community activity, and occupation  PERSONAL FACTORS: Past/current experiences, Time since onset of injury/illness/exacerbation, and 1 comorbidity: DM  are also affecting patient's functional outcome.   REHAB POTENTIAL: Excellent  CLINICAL DECISION MAKING: Stable/uncomplicated  EVALUATION COMPLEXITY: Low   GOALS: Goals reviewed with patient? Yes  SHORT TERM GOALS: Target date: 09/05/2023  Pt will be independent with HEP to improve strength and decrease shoulder pain to improve pain-free function at home and work. Baseline:  Goal status: INITIAL   LONG TERM GOALS: Target date: 10/03/2023  Pt will increase FOTO to at least 67 to demonstrate significant improvement in function at home and work related to shoulder pain  Baseline: 54 Goal status: INITIAL  2.  Pt will decrease worst shoulder pain by at least 3 points on the NPRS in order to demonstrate clinically significant reduction in shoulder pain. Baseline: 7/10; Goal status: INITIAL  3.   Pt will decrease quick DASH score by at least 8% in order to demonstrate clinically significant reduction in disability related to shoulder pain        Baseline: 20.5% Goal status: INITIAL  4. Pt will increase L shoulder pain-free AROM abduction and external rotation range of motion to within 10 degrees of R shoulder in order to demonstrate improvement in functional range of motion.         Baseline: abd/ER: 92/47 both with pain Goal status: INITIAL   PLAN: PT FREQUENCY: 1-2x/week  PT DURATION: 8 weeks  PLANNED INTERVENTIONS: Therapeutic exercises, Therapeutic activity, Neuromuscular re-education, Balance training, Gait training, Patient/Family education, Self Care, Joint mobilization, Joint manipulation, Vestibular training, Canalith repositioning, Orthotic/Fit training, DME instructions, Dry Needling, Electrical stimulation, Spinal manipulation, Spinal mobilization, Cryotherapy, Moist heat, Taping, Traction, Ultrasound, Ionotophoresis 4mg /ml Dexamethasone, Manual therapy, and Re-evaluation.  PLAN FOR NEXT SESSION:  range of motion and manual therapy, consider TDN, progressive strengthening when appropriate  Selinda BIRCH Deepika [place]atur PT, DPT, GCS  09/10/2023, 9:50 AM

## 2023-09-10 ENCOUNTER — Ambulatory Visit: Payer: No Typology Code available for payment source | Attending: Family Medicine

## 2023-09-10 DIAGNOSIS — M25512 Pain in left shoulder: Secondary | ICD-10-CM | POA: Insufficient documentation

## 2023-09-10 DIAGNOSIS — G8929 Other chronic pain: Secondary | ICD-10-CM | POA: Insufficient documentation

## 2023-09-10 DIAGNOSIS — M25612 Stiffness of left shoulder, not elsewhere classified: Secondary | ICD-10-CM | POA: Insufficient documentation

## 2023-09-12 ENCOUNTER — Ambulatory Visit: Payer: No Typology Code available for payment source

## 2023-09-12 DIAGNOSIS — G8929 Other chronic pain: Secondary | ICD-10-CM

## 2023-09-12 DIAGNOSIS — M25612 Stiffness of left shoulder, not elsewhere classified: Secondary | ICD-10-CM

## 2023-09-12 DIAGNOSIS — M25512 Pain in left shoulder: Secondary | ICD-10-CM | POA: Diagnosis not present

## 2023-09-12 NOTE — Therapy (Signed)
 OUTPATIENT PHYSICAL THERAPY SHOULDER TREATMENT  Patient Name: Heather Golden MRN: [id number] DOB:[birth year], 58 y.o., female Today's Date: 09/12/2023  END OF SESSION:  PT End of Session - 09/12/23 0758     Visit Number 8    Number of Visits 17    Date for PT Re-Evaluation 10/03/23    Authorization Type eval: 08/08/23    PT Start Time 0800    PT Stop Time 0845    PT Time Calculation (min) 45 min    Activity Tolerance Patient tolerated treatment well    Behavior During Therapy WFL for tasks assessed/performed            Past Medical History:  Diagnosis Date   Diabetes mellitus without complication (HCC)    controlled - no meds   Past Surgical History:  Procedure Laterality Date   BACK SURGERY     GASTROPLASTY DUODENAL SWITCH     Patient Active Problem List   Diagnosis Date Noted   Dysuria 04/02/2023   Recurrent UTI 04/02/2023   PCP: Kimberlee Platt MD  REFERRING PROVIDER: Toribio Dar FNP  REFERRING DIAG: M75.02 (ICD-10-CM) - Adhesive capsulitis of left shoulder   RATIONALE FOR EVALUATION AND TREATMENT: Rehabilitation  THERAPY DIAG: Chronic left shoulder pain  Stiffness of left shoulder, not elsewhere classified  ONSET DATE: At least 6 months ago  FOLLOW-UP APPT SCHEDULED WITH REFERRING PROVIDER: Yes    FROM INITIAL EVALUATION SUBJECTIVE:                                                                                                                                                                                         SUBJECTIVE STATEMENT:  L shoulder ROM limitations and pain  PERTINENT HISTORY:  Pt reports progressive loss of L shoulder ROM over the period of at least 6 months. Her most limited motions are external rotation, abduction, and horizontal abduction. She was diagnosed with adhesive capsulitis and given her a steroid injection on 06/22/23. Since the steroid injection she has noticed an improvement in her range of motion and pain. She has a remote  history of L shoulder limitations and pain when reaching overhead. PMH includes DM and bariatric surgery. She has lost a significant amount of weight recently since she started taking Wegovy.   PAIN:  Pain Intensity: Present: 0/10, Best: 0/10, Worst: 7/10 Pain location: Generalized L shoulder pain radiating into the L upper arm Pain Quality: Intermittent sharp pain, occasional throbbing pain  Radiating: Yes, into L upper arm Numbness/Tingling: No Focal Weakness: No Aggravating factors: Holding steering wheel, sitting at her desk, horizontal abduction such as when trying to sleep. Relieving factors: steroid injection, avoiding aggravating  positions 24-hour pain behavior: notices pain more at night due to aggravating positions, pain wakes her up if she rolls onto L side History of prior shoulder or neck/shoulder injury, pain, surgery, or therapy: No Falls: Has patient fallen in last 6 months? No, Dominant hand: left Imaging: Yes, plain films but results unavailable for review. Per pt no issues identified on radiographs Red flags (personal history of cancer, chills/fever, night sweats, nausea, vomiting, unrelenting pain, unexplained weight gain/loss): Negative  PRECAUTIONS: None  WEIGHT BEARING RESTRICTIONS: No  FALLS: Has patient fallen in last 6 months? No  Living Environment Lives with: lives alone  Prior level of function: Independent  Occupational demands: Psychologist, Educational  Hobbies: travel, reading, streaming shows/movies, live music  Patient Goals: Sleep on L side, put luggage in overhead bin, reach into cabinets   OBJECTIVE:   Patient Surveys  FOTO: 54, predicted improvement to 69 QuickDASH: To be completed  Cognition Patient is oriented to person, place, and time.  Recent memory is intact.  Remote memory is intact.  Attention span and concentration are intact.  Expressive speech is intact.  Patient's fund of knowledge is within normal limits for educational  level.    Gross Musculoskeletal Assessment Tremor: None Bulk: Normal Tone: Not tested  Gait Deferred  Posture No gross deficits identified contributing to symptoms  Cervical Screen AROM: WFL and painless with overpressure in all planes. No reproduction of shoulder pain. Pt reports some L upper trap tightness with L cervical rotation. Spurlings A (ipsilateral lateral flexion/axial compression): R: Negative L: Negative Spurlings B (ipsilateral lateral flexion/contralateral rotation/axial compression): R: Negative L: Negative Repeated movement: Deferred Hoffman Sign (cervical cord compression): R: Not examined L: Not examined ULTT Median: R: Not examined L: Not examined ULTT Ulnar: R: Not examined L: Not examined ULTT Radial: R: Not examined L: Not examined  AROM AROM (Normal range in degrees) AROM  Cervical  Flexion (50) WNL  Extension (80) Slight limitation, no pain  Right lateral flexion (45) WNL  Left lateral flexion (45) WNL  Right rotation (85) WNL  Left rotation (85) WNL* (L upper trap pain)   Right Left  Shoulder    Flexion 173 155*  Extension WNL WNL  Abduction 166* (pain with OP) 92*  External Rotation 90 47*  Internal Rotation 68 35*  Hands Behind Head T5 T1*  Hands Behind Back T7 Waistline*      Elbow    Flexion WNL WNL  Extension WNL WNL  Pronation    Supination    (* = pain; Blank rows = not tested)  UE MMT: MMT (out of 5) Right Left   Shoulder   Flexion 5 5  Extension 4 4  Abduction 5 5*  External rotation 5 5  Internal rotation 5 5*  Horizontal abduction 4 3+*  Horizontal adduction    Lower Trapezius 4 Unable to test  Rhomboids 4 4*      Elbow  Flexion 5 5  Extension 5 5  Pronation    Supination        Wrist  Flexion 5 5  Extension 5 5  Radial deviation    Ulnar deviation        MCP  Flexion 5 5  Extension 5 5  Abduction    Adduction    (* = pain; Blank rows = not tested)  Sensation Grossly intact to light touch bilateral  UE as determined by testing dermatomes C2-T2. Proprioception and hot/cold testing deferred on this date.  Reflexes Deferred  Palpation Location LEFT  RIGHT           Subocciptials 0   Cervical paraspinals 0   Upper Trapezius 0   Levator Scapulae 0   Rhomboid Major/Minor    Sternoclavicular joint 0   Acromioclavicular joint 0   Coracoid process 0   Long head of biceps 1   Supraspinatus 0   Infraspinatus 1   Subscapularis    Teres Minor    Teres Major    Pectoralis Major 0   Pectoralis Minor    Anterior Deltoid 0   Lateral Deltoid 0   Posterior Deltoid 1   Latissimus Dorsi    Sternocleidomastoid    (Blank rows = not tested) Graded on 0-4 scale (0 = no pain, 1 = pain, 2 = pain with wincing/grimacing/flinching, 3 = pain with withdrawal, 4 = unwilling to allow palpation), (Blank rows = not tested)  Passive Accessory Intervertebral Motion Pt denies reproduction of shoulder pain with CPA C2-T7 and UPA bilaterally C2-T7. Generally, hypomobile throughout  Accessory Motions/Glides Glenohumeral: Posterior: R: not examined L: abnormal severely restricted with pain Inferior: R: not examined L: abnormal, hypomobile but no pain Anterior: R: not examined L: normal  Acromioclavicular:  Posterior: R: not examined L: normal Anterior: R: not examined L: normal  Sternoclavicular: Posterior: R: not examined L: normal Anterior: R: not examined L: normal Superior: R: not examined L: normal Inferior: R: not examined L: normal  Scapulothoracic: Distraction: R: not examined L: not examined Medial: R: not examined L: not examined Lateral: R: not examined L: not examined Inferior: R: not examined L: not examined Superior: R: not examined L: not examined Upward Rotation: R: not examined L: not examined Downward Rotation: R: not examined L: not examined  Muscle Length Testing Pectoralis Major: R: not examined L: normal Pectoralis Minor: R: not examined L: not examined Biceps: R: not  examined L: not examined  SPECIAL TESTS Rotator Cuff  Drop Arm Test: Negative Painful Arc (Pain from 60 to 120 degrees scaption): Negative Infraspinatus Muscle Test: Positive for pain  Subacromial Impingement Hawkins-Kennedy: Not examined Neer (Block scapula, PROM flexion): Positive Painful Arc (Pain from 60 to 120 degrees scaption): Negative Empty Can: Positive External Rotation Resistance: Positive Horizontal Adduction: Not examined Scapular Assist: Not examined  Labral Tear Biceps Load II (120 elevation, full ER, 90 elbow flexion, full supination, resisted elbow flexion): Not examined Crank (160 scaption, axial load with IR/ER): Not examined O'Briens/Active Compression Test (90 shoulder flexion, 10 adduction, full IR): Not examined  Bicep Tendon Pathology Speed (shoulder flexion to 90, external rotation, full elbow extension, and forearm supination with resistance: Not examined Yergason's (resisted shoulder ER and supination/biceps tendon pathology): Not examined  Shoulder Instability Sulcus Sign: Not examined Anterior Apprehension: Not examined  Beighton scale Deferred, no history of hyperflexibility   TODAY'S TREATMENT:    SUBJECTIVE: Patient reports she is doing well today. No significant changes since the last therapy session. She continues to notice improvement in her range of motion. No resting pain upon arrival   PAIN: Denies resting pain;   Ther-ex  UBE x 5 minute (2.5 min forward/2.5 min backwards) at start of session for warm-up AAROM, and strengthening during interval history.  Seated pulleys for L shoulder AAROM flexion and scaption x multiple bouts each with end range hold; Supine L shoulder serratus punch with 3# dumbbell (DB) x 10; Supine L shoulder flexion from neutral to 90 with 3# dumbbell x 10;   Manual Therapy   PROM L shoulder  abduction, flexion, and ER; L GH AP mobilizations at neutral, grade III, 30s/bout x 3 bouts; L GH inferior  mobilizations at 90 abduction grade III, 30s/bout x 3 bouts; L GH AP mobilizations at 90 abduction and available end range ER, grade II, 30s/bout x 3 bouts;  AAROM   Right Left  Shoulder (Front initial evaluation)    Flexion 173 160*  Extension WNL    Abduction 166* (pain with OP) 133*  External Rotation 90 73*  Internal Rotation 68   Hands Behind Head T5    Hands Behind Back T7       Not performed: Seated forward and L lateral table slides to improve L shoulder flexion and abduction respectively; Seated Nautilus lat pull down for overhead stretch as well as strengthening 40# x 10; Seated Nautilus rows 40# 2 x 10; Standing pulleys for L shoulder AAROM IR x multiple bouts with end range hold; Standing doorway stretch for L shoulder ER with elbow at side x 60s, arm at shoulder height x 60s;   PATIENT EDUCATION:  Education details: Plan of care and updated HEP; Person educated: Patient Education method: Explanation Education comprehension: verbalized understanding   HOME EXERCISE PROGRAM:  Access Code: [email or web address] URL: [email or web address] Date: 08/29/2023 Prepared by: Selinda Eck  Exercises - Standing Isometric Shoulder Internal Rotation at Doorway  - 1-2 x daily - 3-5 x weekly - 2 sets - 10 reps - Standing Isometric Shoulder External Rotation with Doorway  - 1-2 x daily - 3-5 x weekly - 2 sets - 10 reps - Standing Isometric Shoulder Flexion with Doorway - Arm Bent  - 1-2 x daily - 3-5 x weekly - 2 sets - 10 reps - Standing Isometric Shoulder Abduction with Doorway - Arm Bent  - 1-2 x daily - 3-5 x weekly - 2 sets - 10 reps - Seated Shoulder Flexion AAROM with Pulley Behind  - 5-7 x weekly - 1-2 sets - 10 reps - Seated Shoulder Abduction AAROM with Pulley Behind  - 7 x weekly - 1-2 sets - 10 reps - Standing Shoulder Internal Rotation AAROM with Pulley (Mirrored)  - 1-2 x daily - 7 x weekly - 10 reps - Seated Shoulder Flexion Slide at Table Top with Forearm in  Neutral  - 7 x weekly - 1-2 sets - 10 reps - Seated Shoulder Abduction Towel Slide at Table Top  - 7 x weekly - 1-2 sets - 10 reps - Standing Shoulder External Rotation Stretch in Doorway  - 1-2 x daily - 7 x weekly - 3-5 reps - 30s hold - Single Arm Doorway Pec Stretch at 90 Degrees Abduction (Mirrored)  - 1-2 x daily - 7 x weekly - 3-5 reps - 30s hold   ASSESSMENT:  CLINICAL IMPRESSION:   Patient arrives to treatment session motivated to participate with no complaints of pain at rest. Session focused on manual interventions, AAROM, and progressive strengthening. Measured L shoulder AAROM and notable improvements noted in abduction and ER. No HEP updates during session today. Pt encouraged to follow-up as scheduled. She will continue to benefit from skilled therapy to address remaining deficits in order to improve quality of life and return to prior level of function.   OBJECTIVE IMPAIRMENTS: decreased ROM, decreased strength, and pain.   ACTIVITY LIMITATIONS: carrying, lifting, dressing, and reach over head  PARTICIPATION LIMITATIONS: meal prep, cleaning, shopping, community activity, and occupation  PERSONAL FACTORS: Past/current experiences, Time since onset of injury/illness/exacerbation, and 1 comorbidity: DM  are also affecting patient's  functional outcome.   REHAB POTENTIAL: Excellent  CLINICAL DECISION MAKING: Stable/uncomplicated  EVALUATION COMPLEXITY: Low   GOALS: Goals reviewed with patient? Yes  SHORT TERM GOALS: Target date: 09/05/2023  Pt will be independent with HEP to improve strength and decrease shoulder pain to improve pain-free function at home and work. Baseline:  Goal status: INITIAL   LONG TERM GOALS: Target date: 10/03/2023  Pt will increase FOTO to at least 67 to demonstrate significant improvement in function at home and work related to shoulder pain  Baseline: 54 Goal status: INITIAL  2.  Pt will decrease worst shoulder pain by at least 3 points on  the NPRS in order to demonstrate clinically significant reduction in shoulder pain. Baseline: 7/10; Goal status: INITIAL  3.  Pt will decrease quick DASH score by at least 8% in order to demonstrate clinically significant reduction in disability related to shoulder pain        Baseline: 20.5% Goal status: INITIAL  4. Pt will increase L shoulder pain-free AROM abduction and external rotation range of motion to within 10 degrees of R shoulder in order to demonstrate improvement in functional range of motion.         Baseline: abd/ER: 92/47 both with pain Goal status: INITIAL   PLAN: PT FREQUENCY: 1-2x/week  PT DURATION: 8 weeks  PLANNED INTERVENTIONS: Therapeutic exercises, Therapeutic activity, Neuromuscular re-education, Balance training, Gait training, Patient/Family education, Self Care, Joint mobilization, Joint manipulation, Vestibular training, Canalith repositioning, Orthotic/Fit training, DME instructions, Dry Needling, Electrical stimulation, Spinal manipulation, Spinal mobilization, Cryotherapy, Moist heat, Taping, Traction, Ultrasound, Ionotophoresis 4mg /ml Dexamethasone, Manual therapy, and Re-evaluation.  PLAN FOR NEXT SESSION:  range of motion and manual therapy, consider TDN, progressive strengthening when appropriate  Selinda BIRCH Daneil Beem PT, DPT, GCS  09/12/2023, 12:08 PM

## 2023-09-14 NOTE — Therapy (Signed)
 OUTPATIENT PHYSICAL THERAPY SHOULDER TREATMENT/DISCHARGE  Patient Name: Heather Golden MRN: [id number] DOB:1966-05-12, 58 y.o., female Today's Date: 09/17/2023  END OF SESSION:  PT End of Session - 09/17/23 0801     Visit Number 9    Number of Visits 17    Date for PT Re-Evaluation 10/03/23    Authorization Type eval: 08/08/23    PT Start Time 0800    PT Stop Time 0845    PT Time Calculation (min) 45 min    Activity Tolerance Patient tolerated treatment well    Behavior During Therapy WFL for tasks assessed/performed            Past Medical History:  Diagnosis Date   Diabetes mellitus without complication (HCC)    controlled - no meds   Past Surgical History:  Procedure Laterality Date   BACK SURGERY     GASTROPLASTY DUODENAL SWITCH     Patient Active Problem List   Diagnosis Date Noted   Dysuria 04/02/2023   Recurrent UTI 04/02/2023   PCP: Kimberlee Platt MD  REFERRING PROVIDER: Toribio Dar FNP  REFERRING DIAG: M75.02 (ICD-10-CM) - Adhesive capsulitis of left shoulder   RATIONALE FOR EVALUATION AND TREATMENT: Rehabilitation  THERAPY DIAG: Chronic left shoulder pain  Stiffness of left shoulder, not elsewhere classified  ONSET DATE: At least 6 months ago  FOLLOW-UP APPT SCHEDULED WITH REFERRING PROVIDER: Yes    FROM INITIAL EVALUATION SUBJECTIVE:                                                                                                                                                                                         SUBJECTIVE STATEMENT:  L shoulder ROM limitations and pain  PERTINENT HISTORY:  Pt reports progressive loss of L shoulder ROM over the period of at least 6 months. Her most limited motions are external rotation, abduction, and horizontal abduction. She was diagnosed with adhesive capsulitis and given her a steroid injection on 06/22/23. Since the steroid injection she has noticed an improvement in her range of motion and pain. She has  a remote history of L shoulder limitations and pain when reaching overhead. PMH includes DM and bariatric surgery. She has lost a significant amount of weight recently since she started taking Wegovy.   PAIN:  Pain Intensity: Present: 0/10, Best: 0/10, Worst: 7/10 Pain location: Generalized L shoulder pain radiating into the L upper arm Pain Quality: Intermittent sharp pain, occasional throbbing pain  Radiating: Yes, into L upper arm Numbness/Tingling: No Focal Weakness: No Aggravating factors: Holding steering wheel, sitting at her desk, horizontal abduction such as when trying to sleep. Relieving factors: steroid injection, avoiding aggravating  positions 24-hour pain behavior: notices pain more at night due to aggravating positions, pain wakes her up if she rolls onto L side History of prior shoulder or neck/shoulder injury, pain, surgery, or therapy: No Falls: Has patient fallen in last 6 months? No, Dominant hand: left Imaging: Yes, plain films but results unavailable for review. Per pt no issues identified on radiographs Red flags (personal history of cancer, chills/fever, night sweats, nausea, vomiting, unrelenting pain, unexplained weight gain/loss): Negative  PRECAUTIONS: None  WEIGHT BEARING RESTRICTIONS: No  FALLS: Has patient fallen in last 6 months? No  Living Environment Lives with: lives alone  Prior level of function: Independent  Occupational demands: Psychologist, Educational  Hobbies: travel, reading, streaming shows/movies, live music  Patient Goals: Sleep on L side, put luggage in overhead bin, reach into cabinets   OBJECTIVE:   Patient Surveys  FOTO: 54, predicted improvement to 60 QuickDASH: To be completed  Cognition Patient is oriented to person, place, and time.  Recent memory is intact.  Remote memory is intact.  Attention span and concentration are intact.  Expressive speech is intact.  Patient's fund of knowledge is within normal limits for educational  level.    Gross Musculoskeletal Assessment Tremor: None Bulk: Normal Tone: Not tested  Gait Deferred  Posture No gross deficits identified contributing to symptoms  Cervical Screen AROM: WFL and painless with overpressure in all planes. No reproduction of shoulder pain. Pt reports some L upper trap tightness with L cervical rotation. Spurlings A (ipsilateral lateral flexion/axial compression): R: Negative L: Negative Spurlings B (ipsilateral lateral flexion/contralateral rotation/axial compression): R: Negative L: Negative Repeated movement: Deferred Hoffman Sign (cervical cord compression): R: Not examined L: Not examined ULTT Median: R: Not examined L: Not examined ULTT Ulnar: R: Not examined L: Not examined ULTT Radial: R: Not examined L: Not examined  AROM AROM (Normal range in degrees) AROM  Cervical  Flexion (50) WNL  Extension (80) Slight limitation, no pain  Right lateral flexion (45) WNL  Left lateral flexion (45) WNL  Right rotation (85) WNL  Left rotation (85) WNL* (L upper trap pain)   Right Left  Shoulder    Flexion 173 155*  Extension WNL WNL  Abduction 166* (pain with OP) 92*  External Rotation 90 47*  Internal Rotation 68 35*  Hands Behind Head T5 T1*  Hands Behind Back T7 Waistline*      Elbow    Flexion WNL WNL  Extension WNL WNL  Pronation    Supination    (* = pain; Blank rows = not tested)  UE MMT: MMT (out of 5) Right Left   Shoulder   Flexion 5 5  Extension 4 4  Abduction 5 5*  External rotation 5 5  Internal rotation 5 5*  Horizontal abduction 4 3+*  Horizontal adduction    Lower Trapezius 4 Unable to test  Rhomboids 4 4*      Elbow  Flexion 5 5  Extension 5 5  Pronation    Supination        Wrist  Flexion 5 5  Extension 5 5  Radial deviation    Ulnar deviation        MCP  Flexion 5 5  Extension 5 5  Abduction    Adduction    (* = pain; Blank rows = not tested)  Sensation Grossly intact to light touch bilateral  UE as determined by testing dermatomes C2-T2. Proprioception and hot/cold testing deferred on this date.  Reflexes Deferred  Palpation Location LEFT  RIGHT           Subocciptials 0   Cervical paraspinals 0   Upper Trapezius 0   Levator Scapulae 0   Rhomboid Major/Minor    Sternoclavicular joint 0   Acromioclavicular joint 0   Coracoid process 0   Long head of biceps 1   Supraspinatus 0   Infraspinatus 1   Subscapularis    Teres Minor    Teres Major    Pectoralis Major 0   Pectoralis Minor    Anterior Deltoid 0   Lateral Deltoid 0   Posterior Deltoid 1   Latissimus Dorsi    Sternocleidomastoid    (Blank rows = not tested) Graded on 0-4 scale (0 = no pain, 1 = pain, 2 = pain with wincing/grimacing/flinching, 3 = pain with withdrawal, 4 = unwilling to allow palpation), (Blank rows = not tested)  Passive Accessory Intervertebral Motion Pt denies reproduction of shoulder pain with CPA C2-T7 and UPA bilaterally C2-T7. Generally, hypomobile throughout  Accessory Motions/Glides Glenohumeral: Posterior: R: not examined L: abnormal severely restricted with pain Inferior: R: not examined L: abnormal, hypomobile but no pain Anterior: R: not examined L: normal  Acromioclavicular:  Posterior: R: not examined L: normal Anterior: R: not examined L: normal  Sternoclavicular: Posterior: R: not examined L: normal Anterior: R: not examined L: normal Superior: R: not examined L: normal Inferior: R: not examined L: normal  Scapulothoracic: Distraction: R: not examined L: not examined Medial: R: not examined L: not examined Lateral: R: not examined L: not examined Inferior: R: not examined L: not examined Superior: R: not examined L: not examined Upward Rotation: R: not examined L: not examined Downward Rotation: R: not examined L: not examined  Muscle Length Testing Pectoralis Major: R: not examined L: normal Pectoralis Minor: R: not examined L: not examined Biceps: R: not  examined L: not examined  SPECIAL TESTS Rotator Cuff  Drop Arm Test: Negative Painful Arc (Pain from 60 to 120 degrees scaption): Negative Infraspinatus Muscle Test: Positive for pain  Subacromial Impingement Hawkins-Kennedy: Not examined Neer (Block scapula, PROM flexion): Positive Painful Arc (Pain from 60 to 120 degrees scaption): Negative Empty Can: Positive External Rotation Resistance: Positive Horizontal Adduction: Not examined Scapular Assist: Not examined  Labral Tear Biceps Load II (120 elevation, full ER, 90 elbow flexion, full supination, resisted elbow flexion): Not examined Crank (160 scaption, axial load with IR/ER): Not examined O'Briens/Active Compression Test (90 shoulder flexion, 10 adduction, full IR): Not examined  Bicep Tendon Pathology Speed (shoulder flexion to 90, external rotation, full elbow extension, and forearm supination with resistance: Not examined Yergason's (resisted shoulder ER and supination/biceps tendon pathology): Not examined  Shoulder Instability Sulcus Sign: Not examined Anterior Apprehension: Not examined  Beighton scale Deferred, no history of hyperflexibility   TODAY'S TREATMENT:    SUBJECTIVE: Patient reports she is doing well today. No significant changes since the last therapy session. She continues to notice improvement in her range of motion and feels like she is ready to continue independently at home.   PAIN: Denies resting pain;   Ther-ex  UBE x 5 minute (2.5 min forward/2.5 min backwards) at start of session for warm-up AAROM, and strengthening during interval history.  Seated pulleys for L shoulder AAROM flexion and scaption x multiple bouts each with end range hold; Standing doorway stretch for L shoulder ER with elbow at side x 60s, arm at shoulder height x 60s; Seated Nautilus lat pull down for overhead stretch  as well as strengthening 40# x 15, 50# 2 x 10; Seated Nautilus rows 40# x 10; Seated W's with  green tband x 10; Wall push-ups x 10; HEP updated and reviewed;   Manual Therapy   PROM L shoulder abduction, flexion, and ER; L GH AP mobilizations at neutral, grade III, 30s/bout x 3 bouts; L GH inferior mobilizations at 90 abduction grade III, 30s/bout x 3 bouts; L GH AP mobilizations at 90 abduction and available end range ER, grade II, 30s/bout x 3 bouts;   PATIENT EDUCATION:  Education details: Plan of care and updated HEP, Discharge Person educated: Patient Education method: Explanation, Demonstration, Verbal cues, and Handouts Education comprehension: verbalized understanding and returned demonstration   HOME EXERCISE PROGRAM:  Access Code: [email or web address] URL: [email or web address] Date: 09/17/2023 Prepared by: Selinda Eck  Exercises - Seated Shoulder Flexion AAROM with Pulley Behind  - 5-7 x weekly - 1-2 sets - 10 reps - Seated Shoulder Abduction AAROM with Pulley Behind  - 7 x weekly - 1-2 sets - 10 reps - Standing Shoulder Internal Rotation AAROM with Pulley (Mirrored)  - 1-2 x daily - 7 x weekly - 10 reps - Standing Shoulder External Rotation Stretch in Doorway  - 1-2 x daily - 7 x weekly - 3-5 reps - 30s hold - Single Arm Doorway Pec Stretch at 90 Degrees Abduction (Mirrored)  - 1-2 x daily - 7 x weekly - 3-5 reps - 30s hold - Shoulder W - External Rotation with Resistance  - 1 x daily - 7 x weekly - 2 sets - 10 reps - 3s hold - Wall Push Up with Plus  - 1 x daily - 7 x weekly - 2 sets - 10 reps - 3s hold - Seated Shoulder Row with Anchored Resistance  - 1 x daily - 7 x weekly - 2 sets - 10 reps - 3s hold   ASSESSMENT:  CLINICAL IMPRESSION:   Patient arrives to treatment session motivated to participate with no complaints of pain at rest. Updated outcome measures today. Her QuickDASH improved from 20.5% at the initial evaluation to 6.8% today and her FOTO improved from 54 to 68. Patient's worst pain has decreased from 7/10 to 3/10 and her abduction and ER  increased from 92 and 47 degrees to 133 and 73 degrees respectively. Updated HEP and pt is independent with her home program. She is ready for discharge today.   OBJECTIVE IMPAIRMENTS: decreased ROM, decreased strength, and pain.   ACTIVITY LIMITATIONS: carrying, lifting, dressing, and reach over head  PARTICIPATION LIMITATIONS: meal prep, cleaning, shopping, community activity, and occupation  PERSONAL FACTORS: Past/current experiences, Time since onset of injury/illness/exacerbation, and 1 comorbidity: DM  are also affecting patient's functional outcome.   REHAB POTENTIAL: Excellent  CLINICAL DECISION MAKING: Stable/uncomplicated  EVALUATION COMPLEXITY: Low   GOALS: Goals reviewed with patient? Yes  SHORT TERM GOALS: Target date: 09/05/2023  Pt will be independent with HEP to improve strength and decrease shoulder pain to improve pain-free function at home and work. Baseline:  Goal status: INITIAL   LONG TERM GOALS: Target date: 10/03/2023  Pt will increase FOTO to at least 67 to demonstrate significant improvement in function at home and work related to shoulder pain  Baseline: 54; 09/17/23: 68;  Goal status: ACHIEVED  2.  Pt will decrease worst shoulder pain by at least 3 points on the NPRS in order to demonstrate clinically significant reduction in shoulder pain. Baseline: 7/10; 09/17/23: 3/10; Goal status: ACHIEVED  3.  Pt  will decrease quick DASH score by at least 8% in order to demonstrate clinically significant reduction in disability related to shoulder pain        Baseline: 20.5%; 09/17/23: 6.8% Goal status: ACHIEVED  4. Pt will increase L shoulder pain-free AROM abduction and external rotation range of motion to within 10 degrees of R shoulder in order to demonstrate improvement in functional range of motion.         Baseline: abd/ER: 92/47 both with pain; 09/12/23: 133/73 both with pain at end range Goal status: PARTIALLY MET   PLAN: PT FREQUENCY: 1-2x/week  PT  DURATION: 8 weeks  PLANNED INTERVENTIONS: Therapeutic exercises, Therapeutic activity, Neuromuscular re-education, Balance training, Gait training, Patient/Family education, Self Care, Joint mobilization, Joint manipulation, Vestibular training, Canalith repositioning, Orthotic/Fit training, DME instructions, Dry Needling, Electrical stimulation, Spinal manipulation, Spinal mobilization, Cryotherapy, Moist heat, Taping, Traction, Ultrasound, Ionotophoresis 4mg /ml Dexamethasone, Manual therapy, and Re-evaluation.  PLAN FOR NEXT SESSION:  Discharge  Selinda JONETTA Eck PT, DPT, GCS  09/17/2023, 10:43 AM

## 2023-09-17 ENCOUNTER — Ambulatory Visit: Payer: No Typology Code available for payment source

## 2023-09-17 DIAGNOSIS — M25512 Pain in left shoulder: Secondary | ICD-10-CM | POA: Diagnosis not present

## 2023-09-17 DIAGNOSIS — M25612 Stiffness of left shoulder, not elsewhere classified: Secondary | ICD-10-CM

## 2023-09-17 DIAGNOSIS — G8929 Other chronic pain: Secondary | ICD-10-CM
